# Patient Record
Sex: Male | Born: 1940 | Race: White | Hispanic: No | Marital: Married | Smoking: Former smoker
Health system: Southern US, Community
[De-identification: ages and names within clinical notes are randomized; demographics above are authoritative.]

## PROBLEM LIST (undated history)

## (undated) DIAGNOSIS — M6281 Muscle weakness (generalized): Secondary | ICD-10-CM

## (undated) DIAGNOSIS — Z9289 Personal history of other medical treatment: Secondary | ICD-10-CM

## (undated) DIAGNOSIS — R3912 Poor urinary stream: Secondary | ICD-10-CM

## (undated) DIAGNOSIS — Z973 Presence of spectacles and contact lenses: Secondary | ICD-10-CM

## (undated) DIAGNOSIS — K219 Gastro-esophageal reflux disease without esophagitis: Secondary | ICD-10-CM

## (undated) DIAGNOSIS — R972 Elevated prostate specific antigen [PSA]: Secondary | ICD-10-CM

## (undated) DIAGNOSIS — N400 Enlarged prostate without lower urinary tract symptoms: Secondary | ICD-10-CM

## (undated) DIAGNOSIS — J449 Chronic obstructive pulmonary disease, unspecified: Secondary | ICD-10-CM

## (undated) DIAGNOSIS — N320 Bladder-neck obstruction: Secondary | ICD-10-CM

## (undated) DIAGNOSIS — R399 Unspecified symptoms and signs involving the genitourinary system: Secondary | ICD-10-CM

## (undated) DIAGNOSIS — B91 Sequelae of poliomyelitis: Secondary | ICD-10-CM

## (undated) DIAGNOSIS — R3911 Hesitancy of micturition: Secondary | ICD-10-CM

## (undated) DIAGNOSIS — Z8619 Personal history of other infectious and parasitic diseases: Secondary | ICD-10-CM

## (undated) HISTORY — PX: TONSILLECTOMY: SUR1361

## (undated) HISTORY — DX: Chronic obstructive pulmonary disease, unspecified: J44.9

---

## 2005-08-29 ENCOUNTER — Ambulatory Visit: Payer: Self-pay | Admitting: General Surgery

## 2007-09-10 ENCOUNTER — Ambulatory Visit: Payer: Self-pay | Admitting: General Surgery

## 2009-06-19 LAB — PULMONARY FUNCTION TEST

## 2009-07-31 ENCOUNTER — Ambulatory Visit: Payer: Self-pay | Admitting: Internal Medicine

## 2010-10-06 LAB — HM COLONOSCOPY: HM Colonoscopy: NORMAL

## 2010-10-07 LAB — HM SIGMOIDOSCOPY: HM Sigmoidoscopy: NORMAL

## 2010-10-20 LAB — PULMONARY FUNCTION TEST

## 2010-12-24 ENCOUNTER — Ambulatory Visit: Payer: Self-pay | Admitting: General Surgery

## 2010-12-28 LAB — PATHOLOGY REPORT

## 2011-04-13 ENCOUNTER — Other Ambulatory Visit: Payer: Self-pay | Admitting: Internal Medicine

## 2011-04-13 DIAGNOSIS — Z0000 Encounter for general adult medical examination without abnormal findings: Secondary | ICD-10-CM

## 2011-04-23 ENCOUNTER — Encounter: Payer: Self-pay | Admitting: Internal Medicine

## 2011-04-27 ENCOUNTER — Encounter: Payer: Self-pay | Admitting: Internal Medicine

## 2011-04-27 ENCOUNTER — Ambulatory Visit (INDEPENDENT_AMBULATORY_CARE_PROVIDER_SITE_OTHER): Payer: No Typology Code available for payment source | Admitting: Internal Medicine

## 2011-04-27 VITALS — BP 134/88 | HR 76 | Temp 98.5°F | Resp 16 | Ht 70.5 in | Wt 191.0 lb

## 2011-04-27 DIAGNOSIS — J449 Chronic obstructive pulmonary disease, unspecified: Secondary | ICD-10-CM

## 2011-04-27 DIAGNOSIS — E785 Hyperlipidemia, unspecified: Secondary | ICD-10-CM

## 2011-04-27 NOTE — Patient Instructions (Signed)

## 2011-04-27 NOTE — Progress Notes (Signed)
Subjective:    Patient ID: Joseph POUND Sr., male    DOB: 1941-07-07, 70 y.o.   MRN: [id number]  HPI Joseph Benson is a 70 year old male with a history of COPD and hyperlipidemia who presents for followup. He reports he has been doing well and he denies any complaints today. We reviewed recent lab reports showing a mildly elevated blood sugar fasting at 113 and an elevated cholesterol with LDL of 143. He reports some recent dietary indiscretion and increased intake of high sugar and high fatty foods. He does exercise by walking his dogs. He has not had any recent shortness of breath or cough greater than his baseline. He was recently seen by his pulmonologist and by his endocrinologist with plan for yearly followup.  Outpatient Encounter Prescriptions as of 04/27/2011  Medication Sig Dispense Refill  . albuterol (PROVENTIL HFA;VENTOLIN HFA) 108 (90 BASE) MCG/ACT inhaler Inhale 2 puffs into the lungs every 4 (four) hours as needed.        . budesonide-formoterol (SYMBICORT) 160-4.5 MCG/ACT inhaler Inhale 2 puffs into the lungs 2 (two) times daily.        . cetirizine (ZYRTEC) 10 MG tablet Take 10 mg by mouth daily.        Marland Kitchen omeprazole (PRILOSEC OTC) 20 MG tablet Take 20 mg by mouth daily.        Marland Kitchen PARoxetine (PAXIL) 20 MG tablet Take 20 mg by mouth daily.        Marland Kitchen terazosin (HYTRIN) 5 MG capsule Take 5 mg by mouth at bedtime.          Review of Systems  Constitutional: Negative for fever, chills, activity change, appetite change, fatigue and unexpected weight change.  Eyes: Negative for visual disturbance.  Respiratory: Negative for cough and shortness of breath.   Cardiovascular: Negative for chest pain, palpitations and leg swelling.  Gastrointestinal: Negative for abdominal pain and abdominal distention.  Genitourinary: Negative for dysuria, urgency and difficulty urinating.  Musculoskeletal: Negative for arthralgias and gait problem.  Skin: Negative for color change and rash.  Hematological:  Negative for adenopathy.  Psychiatric/Behavioral: Negative for sleep disturbance and dysphoric mood. The patient is not nervous/anxious.    BP 134/88  Pulse 76  Temp(Src) 98.5 F (36.9 C) (Oral)  Resp 16  Ht 5' 10.5" (1.791 m)  Wt 191 lb (86.637 kg)  BMI 27.02 kg/m2  SpO2 96%     Objective:   Physical Exam  Constitutional: He is oriented to person, place, and time. He appears well-developed and well-nourished. No distress.  HENT:  Head: Normocephalic and atraumatic.  Right Ear: External ear normal.  Left Ear: External ear normal.  Nose: Nose normal.  Mouth/Throat: Oropharynx is clear and moist. No oropharyngeal exudate.  Eyes: Conjunctivae and EOM are normal. Pupils are equal, round, and reactive to light. Right eye exhibits no discharge. Left eye exhibits no discharge. No scleral icterus.  Neck: Normal range of motion. Neck supple. No tracheal deviation present. No thyromegaly present.  Cardiovascular: Normal rate, regular rhythm and normal heart sounds.  Exam reveals no gallop and no friction rub.   No murmur heard. Pulmonary/Chest: Effort normal and breath sounds normal. No respiratory distress. He has no wheezes. He has no rales. He exhibits no tenderness.  Musculoskeletal: Normal range of motion. He exhibits no edema.  Lymphadenopathy:    He has no cervical adenopathy.  Neurological: He is alert and oriented to person, place, and time. No cranial nerve deficit. Coordination normal.  Skin: Skin is  warm and dry. No rash noted. He is not diaphoretic. No erythema. No pallor.  Psychiatric: He has a normal mood and affect. His behavior is normal. Judgment and thought content normal.          Assessment & Plan:  1. COPD - patient with history of COPD which has been stable. We reviewed notes from his pulmonologist today. He will plan to followup in 6 months.  2. Hyperlipidemia - patient with mildly elevated LDL cholesterol on recent labs. Encouraged a diet low in saturated fat  and high in fiber. We will plan to repeat his cholesterol in 6 months.

## 2011-04-29 ENCOUNTER — Encounter: Payer: Self-pay | Admitting: Internal Medicine

## 2011-05-12 ENCOUNTER — Encounter: Payer: Self-pay | Admitting: Internal Medicine

## 2011-09-19 ENCOUNTER — Other Ambulatory Visit: Payer: Self-pay | Admitting: *Deleted

## 2011-09-19 MED ORDER — TERAZOSIN HCL 5 MG PO CAPS: 5.0000 mg | ORAL_CAPSULE | Freq: Every day | ORAL | Status: DC

## 2011-09-19 MED ORDER — PAROXETINE HCL 20 MG PO TABS: 20.0000 mg | ORAL_TABLET | Freq: Every day | ORAL | Status: DC

## 2011-09-22 DIAGNOSIS — J449 Chronic obstructive pulmonary disease, unspecified: Secondary | ICD-10-CM | POA: Diagnosis not present

## 2011-09-22 LAB — PULMONARY FUNCTION TEST

## 2011-10-11 DIAGNOSIS — E0590 Thyrotoxicosis, unspecified without thyrotoxic crisis or storm: Secondary | ICD-10-CM | POA: Diagnosis not present

## 2011-10-24 ENCOUNTER — Encounter: Payer: Self-pay | Admitting: Internal Medicine

## 2011-10-24 ENCOUNTER — Ambulatory Visit (INDEPENDENT_AMBULATORY_CARE_PROVIDER_SITE_OTHER): Payer: Medicare Other | Admitting: Internal Medicine

## 2011-10-24 VITALS — BP 136/78 | HR 83 | Temp 98.5°F | Ht 70.5 in | Wt 196.0 lb

## 2011-10-24 DIAGNOSIS — I10 Essential (primary) hypertension: Secondary | ICD-10-CM

## 2011-10-24 DIAGNOSIS — Z7184 Encounter for health counseling related to travel: Secondary | ICD-10-CM | POA: Insufficient documentation

## 2011-10-24 DIAGNOSIS — F329 Major depressive disorder, single episode, unspecified: Secondary | ICD-10-CM

## 2011-10-24 DIAGNOSIS — F3289 Other specified depressive episodes: Secondary | ICD-10-CM | POA: Diagnosis not present

## 2011-10-24 DIAGNOSIS — Z7189 Other specified counseling: Secondary | ICD-10-CM

## 2011-10-24 DIAGNOSIS — F32A Depression, unspecified: Secondary | ICD-10-CM | POA: Insufficient documentation

## 2011-10-24 DIAGNOSIS — J449 Chronic obstructive pulmonary disease, unspecified: Secondary | ICD-10-CM | POA: Diagnosis not present

## 2011-10-24 LAB — COMPREHENSIVE METABOLIC PANEL
ALT: 18 U/L (ref 0–53)
AST: 22 U/L (ref 0–37)
Albumin: 4.1 g/dL (ref 3.5–5.2)
Alkaline Phosphatase: 63 U/L (ref 39–117)
Glucose, Bld: 110 mg/dL — ABNORMAL HIGH (ref 70–99)
Potassium: 4.4 mEq/L (ref 3.5–5.1)
Sodium: 140 mEq/L (ref 135–145)
Total Bilirubin: 0.7 mg/dL (ref 0.3–1.2)
Total Protein: 7.6 g/dL (ref 6.0–8.3)

## 2011-10-24 NOTE — Assessment & Plan Note (Signed)
Patient traveling to [place] later this year. We discussed briefly the needed vaccines including hepatitis A and B., and typhoid. We also discussed the need for malaria prophylaxis. He will followup at least 6 weeks prior to travel.

## 2011-10-24 NOTE — Assessment & Plan Note (Signed)
Stable with Paxil. Will continue.

## 2011-10-24 NOTE — Progress Notes (Signed)
Subjective:    Patient ID: Joseph MASH Sr., male    DOB: January 14, 1941, 70 y.o.   MRN: [id number]  HPI 71 year old male with history of COPD presents for followup. He reports he is doing well. He has not had any recent episodes of bronchitis or worsening of shortness of breath. He notes that he is planning to take a trip to [place] later this year and questions what vaccines and medications he may need prior to travel.  Outpatient Encounter Prescriptions as of 10/24/2011  Medication Sig Dispense Refill  . albuterol (PROVENTIL HFA;VENTOLIN HFA) 108 (90 BASE) MCG/ACT inhaler Inhale 2 puffs into the lungs every 4 (four) hours as needed.        . budesonide-formoterol (SYMBICORT) 160-4.5 MCG/ACT inhaler Inhale 2 puffs into the lungs 2 (two) times daily.        . cetirizine (ZYRTEC) 10 MG tablet Take 10 mg by mouth daily.        . methimazole (TAPAZOLE) 5 MG tablet Take 5 mg by mouth daily.      Marland Kitchen PARoxetine (PAXIL) 20 MG tablet Take 1 tablet (20 mg total) by mouth daily.  90 tablet  1  . terazosin (HYTRIN) 5 MG capsule Take 1 capsule (5 mg total) by mouth at bedtime.  90 capsule  1  . omeprazole (PRILOSEC OTC) 20 MG tablet Take 20 mg by mouth daily.          Review of Systems  Constitutional: Negative for fever, chills, activity change, appetite change, fatigue and unexpected weight change.  Eyes: Negative for visual disturbance.  Respiratory: Negative for cough and shortness of breath.   Cardiovascular: Negative for chest pain, palpitations and leg swelling.  Gastrointestinal: Negative for abdominal pain and abdominal distention.  Genitourinary: Negative for dysuria, urgency and difficulty urinating.  Musculoskeletal: Negative for arthralgias and gait problem.  Skin: Negative for color change and rash.  Hematological: Negative for adenopathy.  Psychiatric/Behavioral: Negative for sleep disturbance and dysphoric mood. The patient is not nervous/anxious.    BP 136/78  Pulse 83   Temp(Src) 98.5 F (36.9 C) (Oral)  Ht 5' 10.5" (1.791 m)  Wt 196 lb (88.905 kg)  BMI 27.73 kg/m2  SpO2 95%     Objective:   Physical Exam  Constitutional: He is oriented to person, place, and time. He appears well-developed and well-nourished. No distress.  HENT:  Head: Normocephalic and atraumatic.  Right Ear: External ear normal.  Left Ear: External ear normal.  Nose: Nose normal.  Mouth/Throat: Oropharynx is clear and moist. No oropharyngeal exudate.  Eyes: Conjunctivae and EOM are normal. Pupils are equal, round, and reactive to light. Right eye exhibits no discharge. Left eye exhibits no discharge. No scleral icterus.  Neck: Normal range of motion. Neck supple. No tracheal deviation present. No thyromegaly present.  Cardiovascular: Normal rate, regular rhythm and normal heart sounds.  Exam reveals no gallop and no friction rub.   No murmur heard. Pulmonary/Chest: Effort normal and breath sounds normal. No respiratory distress. He has no wheezes. He has no rales. He exhibits no tenderness.  Musculoskeletal: Normal range of motion. He exhibits no edema.  Lymphadenopathy:    He has no cervical adenopathy.  Neurological: He is alert and oriented to person, place, and time. No cranial nerve deficit. Coordination normal.  Skin: Skin is warm and dry. No rash noted. He is not diaphoretic. No erythema. No pallor.  Psychiatric: He has a normal mood and affect. His behavior is normal. Judgment and thought  content normal.          Assessment & Plan:

## 2011-10-24 NOTE — Assessment & Plan Note (Signed)
Symptoms stable. Will continue Symbicort and prn albuterol. Follow up 6 months.

## 2011-10-25 ENCOUNTER — Ambulatory Visit: Payer: No Typology Code available for payment source | Admitting: Internal Medicine

## 2011-11-17 ENCOUNTER — Encounter: Payer: Self-pay | Admitting: Internal Medicine

## 2011-11-24 ENCOUNTER — Encounter: Payer: Self-pay | Admitting: Internal Medicine

## 2011-12-05 ENCOUNTER — Encounter: Payer: Self-pay | Admitting: Internal Medicine

## 2012-01-13 DIAGNOSIS — E0500 Thyrotoxicosis with diffuse goiter without thyrotoxic crisis or storm: Secondary | ICD-10-CM | POA: Diagnosis not present

## 2012-01-17 DIAGNOSIS — E0590 Thyrotoxicosis, unspecified without thyrotoxic crisis or storm: Secondary | ICD-10-CM | POA: Diagnosis not present

## 2012-01-26 ENCOUNTER — Other Ambulatory Visit: Payer: Self-pay | Admitting: Internal Medicine

## 2012-01-26 NOTE — Telephone Encounter (Signed)
Need to confirm that he has NOT had Hep B vaccine. If not, we can give Twinrix. Otherwise, needs Hep A alone.

## 2012-01-26 NOTE — Telephone Encounter (Signed)
Pt came in today wanting to get a rx for the hep a combo  They are going out the country in oct.

## 2012-01-26 NOTE — Telephone Encounter (Signed)
Spoke with patient via telephone and he stated that he has not had the hepatitis vaccine at all.  He will need Twinrix.  He will check to see what pharmacy has this in stock and call me back.

## 2012-01-30 MED ORDER — HEPATITIS A-HEP B RECOMB VAC 720-20 ELU-MCG/ML IM SUSP: 1.0000 mL | Freq: Once | INTRAMUSCULAR | Status: DC

## 2012-01-30 NOTE — Telephone Encounter (Signed)
Spoke with patient's spouse and was advised that [facility] pharmacy has the Twinrix.  Rx sent.

## 2012-02-28 ENCOUNTER — Ambulatory Visit (INDEPENDENT_AMBULATORY_CARE_PROVIDER_SITE_OTHER): Payer: Medicare Other | Admitting: Internal Medicine

## 2012-02-28 ENCOUNTER — Encounter: Payer: Self-pay | Admitting: Internal Medicine

## 2012-02-28 VITALS — BP 130/70 | HR 73 | Temp 97.9°F | Ht 70.5 in | Wt 193.5 lb

## 2012-02-28 DIAGNOSIS — Z23 Encounter for immunization: Secondary | ICD-10-CM | POA: Diagnosis not present

## 2012-02-28 DIAGNOSIS — Z789 Other specified health status: Secondary | ICD-10-CM | POA: Insufficient documentation

## 2012-02-28 DIAGNOSIS — J449 Chronic obstructive pulmonary disease, unspecified: Secondary | ICD-10-CM | POA: Diagnosis not present

## 2012-02-28 MED ORDER — BUDESONIDE-FORMOTEROL FUMARATE 160-4.5 MCG/ACT IN AERO: 2.0000 | INHALATION_SPRAY | Freq: Two times a day (BID) | RESPIRATORY_TRACT | Status: DC

## 2012-02-28 MED ORDER — ALBUTEROL SULFATE HFA 108 (90 BASE) MCG/ACT IN AERS: 2.0000 | INHALATION_SPRAY | RESPIRATORY_TRACT | Status: AC | PRN

## 2012-02-28 MED ORDER — ZOSTER VACCINE LIVE 19400 UNT/0.65ML ~~LOC~~ SOLR: 0.6500 mL | Freq: Once | SUBCUTANEOUS | Status: AC

## 2012-02-28 NOTE — Assessment & Plan Note (Signed)
Patient is planning upcoming travel to [place]. He has received his first dose of Twinrix. He will need malaria prophylaxis but given ongoing questions about mefloquine and FDA cautionary statements, question ideal malaria prophylaxis. Will set up travel consult with ID physician.

## 2012-02-28 NOTE — Assessment & Plan Note (Signed)
Symptoms currently improved after resuming Symbicort. Will continue. I think it would be wise for him to take prednisone taper pack to use in case of onset of bronchitis while he is traveling to [place]. Will plan to write for this prior to his departure.

## 2012-02-28 NOTE — Progress Notes (Signed)
Subjective:    Patient ID: Joseph DELDUCA Sr., male    DOB: Sep 23, 1940, 71 y.o.   MRN: [id number]  HPI 71 year old male with history of COPD presents for followup. He reports that he is generally feeling well. He notes that he recently was placed on a clinical trial by his pulmonologist. This study involved in taking his Spiriva of his typical Symbicort. He reports that shortly after changing medications he developed marked worsening of shortness of breath, wheezing, and cough productive of purulent sputum. He was ultimately taken off the clinical trial and put on prednisone taper and antibiotics. He reports that symptoms have now improved. Aside from this, he reports he is feeling well. He is planning upcoming travel to [place]. He has completed his first hepatitis a and B. vaccine. He has not yet had a travel consult.  Outpatient Encounter Prescriptions as of 02/28/2012  Medication Sig Dispense Refill  . albuterol (PROVENTIL HFA;VENTOLIN HFA) 108 (90 BASE) MCG/ACT inhaler Inhale 2 puffs into the lungs every 4 (four) hours as needed.  18 g  3  . budesonide-formoterol (SYMBICORT) 160-4.5 MCG/ACT inhaler Inhale 2 puffs into the lungs 2 (two) times daily.  1 Inhaler  3  . cetirizine (ZYRTEC) 10 MG tablet Take 10 mg by mouth daily.        . hepatitis A-hepatitis B (TWINRIX) 720-20 ELU-MCG/ML injection Inject 1 mL into the muscle once.  0.5 mL  3  . methimazole (TAPAZOLE) 5 MG tablet Take 5 mg by mouth daily.      Marland Kitchen omeprazole (PRILOSEC OTC) 20 MG tablet Take 20 mg by mouth daily.        Marland Kitchen PARoxetine (PAXIL) 20 MG tablet Take 1 tablet (20 mg total) by mouth daily.  90 tablet  1  . terazosin (HYTRIN) 5 MG capsule Take 1 capsule (5 mg total) by mouth at bedtime.  90 capsule  1  . DISCONTD: albuterol (PROVENTIL HFA;VENTOLIN HFA) 108 (90 BASE) MCG/ACT inhaler Inhale 2 puffs into the lungs every 4 (four) hours as needed.        Marland Kitchen DISCONTD: budesonide-formoterol (SYMBICORT) 160-4.5 MCG/ACT inhaler Inhale 2  puffs into the lungs 2 (two) times daily.        Marland Kitchen zoster vaccine live, PF, (ZOSTAVAX) 56213 UNT/0.65ML injection Inject 19,400 Units into the skin once.  1 each  0    Review of Systems  Constitutional: Negative for fever, chills, activity change, appetite change, fatigue and unexpected weight change.  Eyes: Negative for visual disturbance.  Respiratory: Negative for cough and shortness of breath.   Cardiovascular: Negative for chest pain, palpitations and leg swelling.  Gastrointestinal: Negative for abdominal pain and abdominal distention.  Genitourinary: Negative for dysuria, urgency and difficulty urinating.  Musculoskeletal: Negative for arthralgias and gait problem.  Skin: Negative for color change and rash.  Hematological: Negative for adenopathy.  Psychiatric/Behavioral: Negative for disturbed wake/sleep cycle and dysphoric mood. The patient is not nervous/anxious.    BP 130/70  Pulse 73  Temp 97.9 F (36.6 C) (Oral)  Ht 5' 10.5" (1.791 m)  Wt 193 lb 8 oz (87.771 kg)  BMI 27.37 kg/m2  SpO2 97%     Objective:   Physical Exam  Constitutional: He is oriented to person, place, and time. He appears well-developed and well-nourished. No distress.  HENT:  Head: Normocephalic and atraumatic.  Right Ear: External ear normal.  Left Ear: External ear normal.  Nose: Nose normal.  Mouth/Throat: Oropharynx is clear and moist. No oropharyngeal exudate.  Eyes: Conjunctivae and EOM are normal. Pupils are equal, round, and reactive to light. Right eye exhibits no discharge. Left eye exhibits no discharge. No scleral icterus.  Neck: Normal range of motion. Neck supple. No tracheal deviation present. No thyromegaly present.  Cardiovascular: Normal rate, regular rhythm and normal heart sounds.  Exam reveals no gallop and no friction rub.   No murmur heard. Pulmonary/Chest: Effort normal and breath sounds normal. No respiratory distress. He has no wheezes. He has no rales. He exhibits no  tenderness.  Musculoskeletal: Normal range of motion. He exhibits no edema.  Lymphadenopathy:    He has no cervical adenopathy.  Neurological: He is alert and oriented to person, place, and time. No cranial nerve deficit. Coordination normal.  Skin: Skin is warm and dry. No rash noted. He is not diaphoretic. No erythema. No pallor.  Psychiatric: He has a normal mood and affect. His behavior is normal. Judgment and thought content normal.          Assessment & Plan:

## 2012-03-12 ENCOUNTER — Other Ambulatory Visit: Payer: Self-pay | Admitting: *Deleted

## 2012-03-12 MED ORDER — TERAZOSIN HCL 5 MG PO CAPS: 5.0000 mg | ORAL_CAPSULE | Freq: Every day | ORAL | Status: DC

## 2012-03-12 MED ORDER — PAROXETINE HCL 20 MG PO TABS: 20.0000 mg | ORAL_TABLET | Freq: Every day | ORAL | Status: DC

## 2012-03-20 DIAGNOSIS — J449 Chronic obstructive pulmonary disease, unspecified: Secondary | ICD-10-CM | POA: Diagnosis not present

## 2012-03-27 ENCOUNTER — Encounter: Payer: Self-pay | Admitting: Internal Medicine

## 2012-04-05 ENCOUNTER — Other Ambulatory Visit: Payer: Self-pay | Admitting: Internal Medicine

## 2012-04-05 DIAGNOSIS — Z23 Encounter for immunization: Secondary | ICD-10-CM

## 2012-04-05 MED ORDER — TETANUS-DIPHTH-ACELL PERTUSSIS 5-2-15.5 LF-MCG/0.5 IM SUSP: 0.5000 mL | Freq: Once | INTRAMUSCULAR | Status: DC

## 2012-04-24 DIAGNOSIS — E0590 Thyrotoxicosis, unspecified without thyrotoxic crisis or storm: Secondary | ICD-10-CM | POA: Diagnosis not present

## 2012-05-02 DIAGNOSIS — Z23 Encounter for immunization: Secondary | ICD-10-CM | POA: Diagnosis not present

## 2012-06-16 DIAGNOSIS — H04129 Dry eye syndrome of unspecified lacrimal gland: Secondary | ICD-10-CM | POA: Diagnosis not present

## 2012-08-31 ENCOUNTER — Ambulatory Visit (INDEPENDENT_AMBULATORY_CARE_PROVIDER_SITE_OTHER): Payer: Medicare Other | Admitting: Internal Medicine

## 2012-08-31 ENCOUNTER — Encounter: Payer: Self-pay | Admitting: Internal Medicine

## 2012-08-31 VITALS — BP 118/56 | HR 68 | Temp 98.7°F | Wt 190.0 lb

## 2012-08-31 DIAGNOSIS — E785 Hyperlipidemia, unspecified: Secondary | ICD-10-CM

## 2012-08-31 DIAGNOSIS — J449 Chronic obstructive pulmonary disease, unspecified: Secondary | ICD-10-CM | POA: Diagnosis not present

## 2012-08-31 DIAGNOSIS — L989 Disorder of the skin and subcutaneous tissue, unspecified: Secondary | ICD-10-CM | POA: Diagnosis not present

## 2012-08-31 DIAGNOSIS — Z23 Encounter for immunization: Secondary | ICD-10-CM

## 2012-08-31 DIAGNOSIS — Z0000 Encounter for general adult medical examination without abnormal findings: Secondary | ICD-10-CM

## 2012-08-31 DIAGNOSIS — J3489 Other specified disorders of nose and nasal sinuses: Secondary | ICD-10-CM

## 2012-08-31 DIAGNOSIS — E0590 Thyrotoxicosis, unspecified without thyrotoxic crisis or storm: Secondary | ICD-10-CM

## 2012-08-31 LAB — LIPID PANEL
Cholesterol: 203 mg/dL — ABNORMAL HIGH (ref 0–200)
HDL: 43.3 mg/dL (ref >39.00)
Total CHOL/HDL Ratio: 5
Triglycerides: 85 mg/dL (ref 0.0–149.0)
VLDL: 17 mg/dL (ref 0.0–40.0)

## 2012-08-31 LAB — COMPREHENSIVE METABOLIC PANEL
ALT: 18 U/L (ref 0–53)
AST: 24 U/L (ref 0–37)
Alkaline Phosphatase: 60 U/L (ref 39–117)
Calcium: 9.2 mg/dL (ref 8.4–10.5)
Chloride: 107 mEq/L (ref 96–112)
Creatinine, Ser: 1 mg/dL (ref 0.4–1.5)

## 2012-08-31 LAB — T4, FREE: Free T4: 0.75 ng/dL (ref 0.60–1.60)

## 2012-08-31 MED ORDER — ZOSTER VACCINE LIVE 19400 UNT/0.65ML ~~LOC~~ SOLR: 0.6500 mL | Freq: Once | SUBCUTANEOUS | Status: DC

## 2012-08-31 MED ORDER — GENTAMICIN SULFATE 0.1 % EX OINT: TOPICAL_OINTMENT | Freq: Three times a day (TID) | CUTANEOUS | Status: DC

## 2012-08-31 NOTE — Assessment & Plan Note (Signed)
Symptoms very well controlled with Symbicort and prn albuterol. Will continue.

## 2012-08-31 NOTE — Assessment & Plan Note (Signed)
Nasal ulceration most consistent with impetigo. Will treat with topical gentamicin. Pt will call if no improvement.

## 2012-08-31 NOTE — Progress Notes (Signed)
Subjective:    Patient ID: Joseph MOWATT Sr., male    DOB: May 05, 1941, 72 y.o.   MRN: [id number]  HPI 72 year old male with history of COPD and subclinical hyperthyroidism presents for followup. He recently came back from a trip to [place]. He reports he has been feeling well. He reports symptoms of shortness of breath have been. Well-controlled with use of Symbicort and albuterol as needed. He denies any ongoing cough or exercise intolerance. Next  He is concerned today about skin lesion on his left medial forehead. He is unsure how long this has been present. It is red and sometimes scaling. It is not painful.  He is also concerned today about a lesion in his right nostril. He is unsure how long this has been present. It is described as painful. It sometimes drains clear fluid.  Outpatient Encounter Prescriptions as of 08/31/2012  Medication Sig Dispense Refill  . albuterol (PROVENTIL HFA;VENTOLIN HFA) 108 (90 BASE) MCG/ACT inhaler Inhale 2 puffs into the lungs every 4 (four) hours as needed.  18 g  3  . budesonide-formoterol (SYMBICORT) 160-4.5 MCG/ACT inhaler Inhale 2 puffs into the lungs 2 (two) times daily.  1 Inhaler  3  . cetirizine (ZYRTEC) 10 MG tablet Take 10 mg by mouth daily.        . methimazole (TAPAZOLE) 5 MG tablet Take 5 mg by mouth daily.      Marland Kitchen omeprazole (PRILOSEC OTC) 20 MG tablet Take 20 mg by mouth daily.        Marland Kitchen PARoxetine (PAXIL) 20 MG tablet Take 1 tablet (20 mg total) by mouth daily.  90 tablet  1  . terazosin (HYTRIN) 5 MG capsule Take 1 capsule (5 mg total) by mouth at bedtime.  90 capsule  1   BP 118/56  Pulse 68  Temp 98.7 F (37.1 C)  Wt 190 lb (86.183 kg)  Review of Systems  Constitutional: Negative for fever, chills, activity change, appetite change, fatigue and unexpected weight change.  Eyes: Negative for visual disturbance.  Respiratory: Negative for cough and shortness of breath.   Cardiovascular: Negative for chest pain, palpitations and leg  swelling.  Gastrointestinal: Negative for abdominal pain and abdominal distention.  Genitourinary: Negative for dysuria, urgency and difficulty urinating.  Musculoskeletal: Negative for arthralgias and gait problem.  Skin: Positive for color change and wound. Negative for rash.  Hematological: Negative for adenopathy.  Psychiatric/Behavioral: Negative for sleep disturbance and dysphoric mood. The patient is not nervous/anxious.        Objective:   Physical Exam  Constitutional: He is oriented to person, place, and time. He appears well-developed and well-nourished. No distress.  HENT:  Head: Normocephalic and atraumatic.    Right Ear: External ear normal.  Left Ear: External ear normal.  Nose: Nose normal.    Mouth/Throat: Oropharynx is clear and moist. No oropharyngeal exudate.  Eyes: Conjunctivae normal and EOM are normal. Pupils are equal, round, and reactive to light. Right eye exhibits no discharge. Left eye exhibits no discharge. No scleral icterus.  Neck: Normal range of motion. Neck supple. No tracheal deviation present. No thyromegaly present.  Cardiovascular: Normal rate, regular rhythm and normal heart sounds.  Exam reveals no gallop and no friction rub.   No murmur heard. Pulmonary/Chest: Effort normal and breath sounds normal. No accessory muscle usage. Not tachypneic. No respiratory distress. He has no decreased breath sounds. He has no wheezes. He has no rhonchi. He has no rales. He exhibits no tenderness.  Musculoskeletal: Normal  range of motion. He exhibits no edema.  Lymphadenopathy:    He has no cervical adenopathy.  Neurological: He is alert and oriented to person, place, and time. No cranial nerve deficit. Coordination normal.  Skin: Skin is warm and dry. No rash noted. He is not diaphoretic. No erythema. No pallor.  Psychiatric: He has a normal mood and affect. His behavior is normal. Judgment and thought content normal.          Assessment & Plan:

## 2012-08-31 NOTE — Assessment & Plan Note (Signed)
Will recheck TSH and free T4. Continue methimazole. Follow up with Endocrine as scheduled.

## 2012-09-03 ENCOUNTER — Other Ambulatory Visit: Payer: Self-pay | Admitting: *Deleted

## 2012-09-03 MED ORDER — PAROXETINE HCL 20 MG PO TABS: 20.0000 mg | ORAL_TABLET | Freq: Every day | ORAL | Status: DC

## 2012-09-03 MED ORDER — TERAZOSIN HCL 5 MG PO CAPS: 5.0000 mg | ORAL_CAPSULE | Freq: Every day | ORAL | Status: DC

## 2012-09-15 ENCOUNTER — Other Ambulatory Visit: Payer: Self-pay

## 2012-09-19 DIAGNOSIS — J449 Chronic obstructive pulmonary disease, unspecified: Secondary | ICD-10-CM | POA: Diagnosis not present

## 2012-09-20 ENCOUNTER — Other Ambulatory Visit: Payer: Self-pay | Admitting: *Deleted

## 2012-09-20 MED ORDER — PAROXETINE HCL 20 MG PO TABS: 20.0000 mg | ORAL_TABLET | Freq: Every day | ORAL | Status: DC

## 2012-09-23 ENCOUNTER — Encounter: Payer: Self-pay | Admitting: Internal Medicine

## 2012-09-24 ENCOUNTER — Telehealth: Payer: Self-pay | Admitting: Internal Medicine

## 2012-09-24 MED ORDER — TERAZOSIN HCL 5 MG PO CAPS: 5.0000 mg | ORAL_CAPSULE | Freq: Every day | ORAL | Status: DC

## 2012-09-24 MED ORDER — PAROXETINE HCL 20 MG PO TABS: 20.0000 mg | ORAL_TABLET | Freq: Every day | ORAL | Status: DC

## 2012-09-24 NOTE — Telephone Encounter (Signed)
Pt came in today checking on the terazosin and paroxetine rx.   It looks like rx went to [facility] stated he does not go to [facility]   These rx needs [facility] Pt is completely our of his meds  Needs meds for tonight Please advise pt when this has been called in.

## 2012-09-24 NOTE — Telephone Encounter (Signed)
Rx sent to [facility] per patient request

## 2012-09-30 ENCOUNTER — Encounter: Payer: Self-pay | Admitting: Internal Medicine

## 2012-10-09 DIAGNOSIS — L578 Other skin changes due to chronic exposure to nonionizing radiation: Secondary | ICD-10-CM | POA: Diagnosis not present

## 2012-10-09 DIAGNOSIS — L570 Actinic keratosis: Secondary | ICD-10-CM | POA: Diagnosis not present

## 2012-10-09 DIAGNOSIS — L819 Disorder of pigmentation, unspecified: Secondary | ICD-10-CM | POA: Diagnosis not present

## 2012-10-16 DIAGNOSIS — E0590 Thyrotoxicosis, unspecified without thyrotoxic crisis or storm: Secondary | ICD-10-CM | POA: Diagnosis not present

## 2012-10-18 ENCOUNTER — Other Ambulatory Visit: Payer: Self-pay | Admitting: *Deleted

## 2012-10-18 MED ORDER — PAROXETINE HCL 20 MG PO TABS: 20.0000 mg | ORAL_TABLET | Freq: Every day | ORAL | Status: DC

## 2012-10-18 MED ORDER — METHIMAZOLE 5 MG PO TABS: 5.0000 mg | ORAL_TABLET | Freq: Every day | ORAL | Status: DC

## 2012-10-18 NOTE — Telephone Encounter (Signed)
Rx sent to [facility] pharmacy

## 2012-10-23 DIAGNOSIS — E0590 Thyrotoxicosis, unspecified without thyrotoxic crisis or storm: Secondary | ICD-10-CM | POA: Diagnosis not present

## 2012-11-02 ENCOUNTER — Other Ambulatory Visit: Payer: Self-pay | Admitting: Internal Medicine

## 2012-11-20 ENCOUNTER — Telehealth: Payer: Self-pay | Admitting: Internal Medicine

## 2012-11-20 NOTE — Telephone Encounter (Signed)
Patient overbooked and confirmed appointment time for tomorrow

## 2012-11-20 NOTE — Telephone Encounter (Signed)
We can see tomorrow 11:15

## 2012-11-20 NOTE — Telephone Encounter (Signed)
Fwd to Dr. Walker 

## 2012-11-20 NOTE — Telephone Encounter (Signed)
Can we put him in the 11:15am same day visit for tomorrow?

## 2012-11-20 NOTE — Telephone Encounter (Signed)
Wife called in and states patient is no longer able to control pain with Aleve in his legs. He states that the pain is like a sharp pain going down his legs, the pain has gotten worse over the last few days.  Wife states patient has a history of Polo and he isn't always so willing to tell her that he is in pain. She has left a message for triage nurse however hasn't gotten a call back. I gave him an appointment for Thursday but if he needs to come in earlier or try something different to help with the pain.  She will be at her work number until 1:30 if you would like to discuss more information with her or you can call the patient.

## 2012-11-20 NOTE — Telephone Encounter (Signed)
CAN has already used that appointment slot, but I also gave it to [name]. Overbook or reschedule one of them?

## 2012-11-20 NOTE — Telephone Encounter (Signed)
Patient Information:  Caller Name: Britta Mccreedy (nursing Triage)  Phone: [phone]  Patient: Joseph Benson, Joseph Benson  Gender: Male  DOB: 06-Apr-1941  Age: 72 Years  PCP: Ronna Polio (Adults only)  Office Follow Up:  Does the office need to follow up with this patient?: Yes  Instructions For The Office: no appts available in system; info to office for staff/provider review/workin appt krs/can  RN Note:  Patient has history of polio and chronic back pain.  Onset of new pain which is now going down both legs.  States unable to stand comfortable.  Does not recall injury.  Pain initially has been going down left leg, but now is down both legs as of 11/17/12.  States was given appt 11/22/12 1415 for this but wanted triage. Per back pain protocol, advised appt now; no appts available in Epic.  Info to office/high priority for provider/staff review/workin appt.  May reach spouse at [phone].  krs/can  Symptoms  Reason For Call & Symptoms: back pain  Reviewed Health History In EMR: Yes  Reviewed Medications In EMR: Yes  Reviewed Allergies In EMR: Yes  Reviewed Surgeries / Procedures: Yes  Date of Onset of Symptoms: 11/16/2012  Guideline(s) Used:  Back Pain  Disposition Per Guideline:   Go to Office Now  Reason For Disposition Reached:   Severe back pain  Advice Given:  N/A  Patient Will Follow Care Advice:  YES

## 2012-11-20 NOTE — Telephone Encounter (Signed)
Fine to overbook

## 2012-11-21 ENCOUNTER — Ambulatory Visit (INDEPENDENT_AMBULATORY_CARE_PROVIDER_SITE_OTHER)
Admission: RE | Admit: 2012-11-21 | Discharge: 2012-11-21 | Disposition: A | Discharge status: Home / Self Care | Payer: Medicare Other | Source: Ambulatory Visit | Attending: Internal Medicine | Admitting: Internal Medicine

## 2012-11-21 ENCOUNTER — Other Ambulatory Visit: Payer: Self-pay | Admitting: Internal Medicine

## 2012-11-21 ENCOUNTER — Ambulatory Visit (INDEPENDENT_AMBULATORY_CARE_PROVIDER_SITE_OTHER): Payer: Medicare Other | Admitting: Internal Medicine

## 2012-11-21 ENCOUNTER — Encounter: Payer: Self-pay | Admitting: Internal Medicine

## 2012-11-21 VITALS — BP 122/70 | HR 97 | Temp 98.3°F | Wt 184.0 lb

## 2012-11-21 DIAGNOSIS — M79605 Pain in left leg: Secondary | ICD-10-CM

## 2012-11-21 DIAGNOSIS — M545 Low back pain, unspecified: Secondary | ICD-10-CM

## 2012-11-21 DIAGNOSIS — Z8612 Personal history of poliomyelitis: Secondary | ICD-10-CM | POA: Insufficient documentation

## 2012-11-21 DIAGNOSIS — M47817 Spondylosis without myelopathy or radiculopathy, lumbosacral region: Secondary | ICD-10-CM | POA: Diagnosis not present

## 2012-11-21 MED ORDER — HYDROCODONE-ACETAMINOPHEN 5-325 MG PO TABS: 1.0000 | ORAL_TABLET | Freq: Four times a day (QID) | ORAL | Status: DC | PRN

## 2012-11-21 MED ORDER — PREDNISONE (PAK) 10 MG PO TABS: ORAL_TABLET | ORAL | Status: DC

## 2012-11-21 NOTE — Assessment & Plan Note (Signed)
Severe, relatively sudden worsening of low back pain concerning for ruptured disc. Will get plain film lumbar spine. Will start prednisone taper pack. If xray unrevealing, discussed setting up MRI lumbar spine. Will use hydrocodone prn severe pain. Follow up 1 week.

## 2012-11-21 NOTE — Progress Notes (Signed)
Subjective:    Patient ID: Joseph ZERBE Sr., male    DOB: 08/04/1940, 72 y.o.   MRN: [id number]  HPI 72 year old male with history of COPD and chronic mild low back pain presents for acute visit complaining of several weeks of gradually worsening low back pain. Pain is described as a sharp, located in the bilateral lower back, radiating to both posterior thighs. Pain is improved by sitting up and leaning forward. It is made worse by any sudden movement including coughing as well as lying flat. He denies any known trauma to his back. He denies any change in activities except for some increase in yard work over the last few weeks. He has been taking ibuprofen, Aleve, and using his wife's hydrocodone with minimal improvement. He is having difficulty functioning because of severe pain. Occasionally, he feels that his legs will give out on him. He denies any loss of continence of bowel or bladder. He denies any numbness in his legs.  Outpatient Prescriptions Prior to Visit  Medication Sig Dispense Refill  . albuterol (PROVENTIL HFA;VENTOLIN HFA) 108 (90 BASE) MCG/ACT inhaler Inhale 2 puffs into the lungs every 4 (four) hours as needed.  18 g  3  . budesonide-formoterol (SYMBICORT) 160-4.5 MCG/ACT inhaler Inhale 2 puffs into the lungs 2 (two) times daily.  1 Inhaler  3  . cetirizine (ZYRTEC) 10 MG tablet Take 10 mg by mouth daily.        Marland Kitchen omeprazole (PRILOSEC OTC) 20 MG tablet Take 20 mg by mouth daily.        Marland Kitchen PARoxetine (PAXIL) 20 MG tablet Take 1 tablet (20 mg total) by mouth daily.  30 tablet  11  . terazosin (HYTRIN) 5 MG capsule TAKE  ONE CAPSULE BY MOUTH NIGHTLY AT BEDTIME  60 capsule  6  . gentamicin ointment (GARAMYCIN) 0.1 % Apply topically 3 (three) times daily.  15 g  0  . methimazole (TAPAZOLE) 5 MG tablet Take 1 tablet (5 mg total) by mouth daily.  30 tablet  11  . zoster vaccine live, PF, (ZOSTAVAX) 40981 UNT/0.65ML injection Inject 19,400 Units into the skin once.  1 each  0   No  facility-administered medications prior to visit.    Review of Systems  Constitutional: Negative for fever, chills, activity change, appetite change, fatigue and unexpected weight change.  Eyes: Negative for visual disturbance.  Respiratory: Negative for cough and shortness of breath.   Cardiovascular: Negative for chest pain, palpitations and leg swelling.  Gastrointestinal: Negative for abdominal pain and abdominal distention.  Genitourinary: Negative for dysuria, urgency and difficulty urinating.  Musculoskeletal: Positive for myalgias, back pain, arthralgias and gait problem.  Skin: Negative for color change and rash.  Neurological: Positive for weakness. Negative for numbness.  Hematological: Negative for adenopathy.  Psychiatric/Behavioral: Negative for sleep disturbance and dysphoric mood. The patient is not nervous/anxious.        Objective:   Physical Exam  Constitutional: He is oriented to person, place, and time. He appears well-developed and well-nourished. No distress.  HENT:  Head: Normocephalic and atraumatic.  Right Ear: External ear normal.  Left Ear: External ear normal.  Nose: Nose normal.  Mouth/Throat: Oropharynx is clear and moist. No oropharyngeal exudate.  Eyes: Conjunctivae and EOM are normal. Pupils are equal, round, and reactive to light. Right eye exhibits no discharge. Left eye exhibits no discharge. No scleral icterus.  Neck: Normal range of motion. Neck supple. No tracheal deviation present. No thyromegaly present.  Cardiovascular: Normal rate,  regular rhythm and normal heart sounds.  Exam reveals no gallop and no friction rub.   No murmur heard. Pulmonary/Chest: Effort normal and breath sounds normal. No respiratory distress. He has no wheezes. He has no rales. He exhibits no tenderness.  Musculoskeletal: He exhibits no edema.       Lumbar back: He exhibits decreased range of motion, pain and spasm. He exhibits no tenderness, no bony tenderness, no  edema and no deformity.  Lymphadenopathy:    He has no cervical adenopathy.  Neurological: He is alert and oriented to person, place, and time. No cranial nerve deficit. Coordination normal.  Skin: Skin is warm and dry. No rash noted. He is not diaphoretic. No erythema. No pallor.  Psychiatric: He has a normal mood and affect. His behavior is normal. Judgment and thought content normal.          Assessment & Plan:

## 2012-11-21 NOTE — Assessment & Plan Note (Signed)
Patient with history of polio as a teenager. Now having some progressive neurologic symptoms including chronic progression of lower back and sciatic pain as well as intermittent right upper extremity tremor and weakness. Discussed the setting of neurology evaluation with patient and his wife. Will look into neurology referral to specialist in this area.

## 2012-11-22 ENCOUNTER — Ambulatory Visit: Payer: Medicare Other | Admitting: Internal Medicine

## 2012-11-27 DIAGNOSIS — M549 Dorsalgia, unspecified: Secondary | ICD-10-CM | POA: Diagnosis not present

## 2012-11-27 DIAGNOSIS — M1990 Unspecified osteoarthritis, unspecified site: Secondary | ICD-10-CM | POA: Diagnosis not present

## 2012-11-29 DIAGNOSIS — M1990 Unspecified osteoarthritis, unspecified site: Secondary | ICD-10-CM | POA: Diagnosis not present

## 2012-11-29 DIAGNOSIS — M549 Dorsalgia, unspecified: Secondary | ICD-10-CM | POA: Diagnosis not present

## 2012-12-30 DIAGNOSIS — M1990 Unspecified osteoarthritis, unspecified site: Secondary | ICD-10-CM | POA: Diagnosis not present

## 2012-12-30 DIAGNOSIS — M549 Dorsalgia, unspecified: Secondary | ICD-10-CM | POA: Diagnosis not present

## 2013-01-01 DIAGNOSIS — M549 Dorsalgia, unspecified: Secondary | ICD-10-CM | POA: Diagnosis not present

## 2013-01-01 DIAGNOSIS — M1990 Unspecified osteoarthritis, unspecified site: Secondary | ICD-10-CM | POA: Diagnosis not present

## 2013-02-27 DIAGNOSIS — J449 Chronic obstructive pulmonary disease, unspecified: Secondary | ICD-10-CM | POA: Diagnosis not present

## 2013-04-23 DIAGNOSIS — E0590 Thyrotoxicosis, unspecified without thyrotoxic crisis or storm: Secondary | ICD-10-CM | POA: Diagnosis not present

## 2013-04-30 DIAGNOSIS — E0500 Thyrotoxicosis with diffuse goiter without thyrotoxic crisis or storm: Secondary | ICD-10-CM | POA: Diagnosis not present

## 2013-05-30 DIAGNOSIS — Z23 Encounter for immunization: Secondary | ICD-10-CM | POA: Diagnosis not present

## 2013-06-06 ENCOUNTER — Other Ambulatory Visit: Payer: Self-pay

## 2013-08-30 DIAGNOSIS — J449 Chronic obstructive pulmonary disease, unspecified: Secondary | ICD-10-CM | POA: Diagnosis not present

## 2013-09-30 DIAGNOSIS — R05 Cough: Secondary | ICD-10-CM | POA: Diagnosis not present

## 2013-09-30 DIAGNOSIS — J209 Acute bronchitis, unspecified: Secondary | ICD-10-CM | POA: Diagnosis not present

## 2013-09-30 DIAGNOSIS — R059 Cough, unspecified: Secondary | ICD-10-CM | POA: Diagnosis not present

## 2013-09-30 DIAGNOSIS — J449 Chronic obstructive pulmonary disease, unspecified: Secondary | ICD-10-CM | POA: Diagnosis not present

## 2013-11-11 ENCOUNTER — Other Ambulatory Visit: Payer: Self-pay | Admitting: Internal Medicine

## 2013-11-11 NOTE — Telephone Encounter (Signed)
Last appt 11/21/12

## 2013-11-25 ENCOUNTER — Telehealth: Payer: Self-pay | Admitting: Internal Medicine

## 2013-11-28 ENCOUNTER — Encounter: Payer: Self-pay | Admitting: Adult Health

## 2013-11-28 ENCOUNTER — Ambulatory Visit (INDEPENDENT_AMBULATORY_CARE_PROVIDER_SITE_OTHER): Payer: Medicare Other | Admitting: Adult Health

## 2013-11-28 VITALS — BP 132/78 | HR 86 | Temp 98.0°F | Resp 14 | Wt 192.5 lb

## 2013-11-28 DIAGNOSIS — M545 Low back pain, unspecified: Secondary | ICD-10-CM | POA: Diagnosis not present

## 2013-11-28 MED ORDER — PREDNISONE 10 MG PO TABS: ORAL_TABLET | ORAL | Status: DC

## 2013-11-28 MED ORDER — METHOCARBAMOL 750 MG PO TABS: 750.0000 mg | ORAL_TABLET | Freq: Three times a day (TID) | ORAL | Status: DC | PRN

## 2013-11-28 MED ORDER — HYDROCODONE-ACETAMINOPHEN 5-325 MG PO TABS: 1.0000 | ORAL_TABLET | Freq: Four times a day (QID) | ORAL | Status: DC | PRN

## 2013-11-28 NOTE — Patient Instructions (Addendum)
Start prednisone taper as follows:  Day #1 - take 6 tablets Day #2 - take 5 tablets Day #3 - take 4 tablets Day #4 - take 3 tablets Day #5 - take 2 tablets Day #6 - take 1 tablet  Robaxin 3 times a day as needed for muscle spasms  Do not take any NSAIDS while on the prednisone  Norco 1 tablets as needed every 6 hours for pain.  Ice 4-5 times a day for 15 minutes. May alternate with moist heat.  You may benefit from seeing a chiropractor  Sciatica Sciatica is pain, weakness, numbness, or tingling along the path of the sciatic nerve. The nerve starts in the lower back and runs down the back of each leg. The nerve controls the muscles in the lower leg and in the back of the knee, while also providing sensation to the back of the thigh, lower leg, and the sole of your foot. Sciatica is a symptom of another medical condition. For instance, nerve damage or certain conditions, such as a herniated disk or bone spur on the spine, pinch or put pressure on the sciatic nerve. This causes the pain, weakness, or other sensations normally associated with sciatica. Generally, sciatica only affects one side of the body. CAUSES   Herniated or slipped disc.  Degenerative disk disease.  A pain disorder involving the narrow muscle in the buttocks (piriformis syndrome).  Pelvic injury or fracture.  Pregnancy.  Tumor (rare). SYMPTOMS  Symptoms can vary from mild to very severe. The symptoms usually travel from the low back to the buttocks and down the back of the leg. Symptoms can include:  Mild tingling or dull aches in the lower back, leg, or hip.  Numbness in the back of the calf or sole of the foot.  Burning sensations in the lower back, leg, or hip.  Sharp pains in the lower back, leg, or hip.  Leg weakness.  Severe back pain inhibiting movement. These symptoms may get worse with coughing, sneezing, laughing, or prolonged sitting or standing. Also, being overweight may worsen  symptoms. DIAGNOSIS  Your caregiver will perform a physical exam to look for common symptoms of sciatica. He or she may ask you to do certain movements or activities that would trigger sciatic nerve pain. Other tests may be performed to find the cause of the sciatica. These may include:  Blood tests.  X-rays.  Imaging tests, such as an MRI or CT scan. TREATMENT  Treatment is directed at the cause of the sciatic pain. Sometimes, treatment is not necessary and the pain and discomfort goes away on its own. If treatment is needed, your caregiver may suggest:  Over-the-counter medicines to relieve pain.  Prescription medicines, such as anti-inflammatory medicine, muscle relaxants, or narcotics.  Applying heat or ice to the painful area.  Steroid injections to lessen pain, irritation, and inflammation around the nerve.  Reducing activity during periods of pain.  Exercising and stretching to strengthen your abdomen and improve flexibility of your spine. Your caregiver may suggest losing weight if the extra weight makes the back pain worse.  Physical therapy.  Surgery to eliminate what is pressing or pinching the nerve, such as a bone spur or part of a herniated disk. HOME CARE INSTRUCTIONS   Only take over-the-counter or prescription medicines for pain or discomfort as directed by your caregiver.  Apply ice to the affected area for 20 minutes, 3 4 times a day for the first 48 72 hours. Then try heat in  the same way.  Exercise, stretch, or perform your usual activities if these do not aggravate your pain.  Attend physical therapy sessions as directed by your caregiver.  Keep all follow-up appointments as directed by your caregiver.  Do not wear high heels or shoes that do not provide proper support.  Check your mattress to see if it is too soft. A firm mattress may lessen your pain and discomfort. SEEK IMMEDIATE MEDICAL CARE IF:   You lose control of your bowel or bladder  (incontinence).  You have increasing weakness in the lower back, pelvis, buttocks, or legs.  You have redness or swelling of your back.  You have a burning sensation when you urinate.  You have pain that gets worse when you lie down or awakens you at night.  Your pain is worse than you have experienced in the past.  Your pain is lasting longer than 4 weeks.  You are suddenly losing weight without reason. MAKE SURE YOU:  Understand these instructions.  Will watch your condition.  Will get help right away if you are not doing well or get worse. Document Released: 07/12/2001 Document Revised: 01/17/2012 Document Reviewed: 11/27/2011 [facility] Patient Information 2014 [place], [place].

## 2013-11-28 NOTE — Progress Notes (Signed)
Pre visit review using our clinic review tool, if applicable. No additional management support is needed unless otherwise documented below in the visit note. 

## 2013-11-28 NOTE — Progress Notes (Signed)
   Subjective:    Patient ID: Joseph CornerGeorge R Rufo Sr., male    DOB: Dec 28, 1940, 73 y.o.   MRN: [id number]  HPI Pt is a 73 y/o male who presents to clinic with low back pain radiating down both legs however the left is worse than the right. He was recently on a cruise when his symptoms began. His wife reports that they were unable to do anything on their cruise. They saw the doctor on the ship and was only given NSAIDs and told to apply ice. Patient is status post physical therapy. Recent x-ray of the lumbar spine shows some degenerative changes and anterolisthesis of L4-L5.  Past Medical History  Diagnosis Date  . COPD (chronic obstructive pulmonary disease)     Pt. is being followed by [name].  He has done very well on Symbicort and prn albuterol.  . Thyroid disease     P. noted to have low TSH and normal T4 on labs in past, consistent with subclinical hyperthyroidism. Reports that he was seen by endocrinology and had thyroid US which was normal  . 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   Eyes: Negative.   Respiratory: Negative.   Cardiovascular: Negative.  Negative for chest pain, palpitations and leg swelling.  Genitourinary: Negative.   Musculoskeletal: Positive for back pain (radiating down both legs). Negative for gait problem.  Neurological: Negative.   Psychiatric/Behavioral: Negative.   All other systems reviewed and are negative.      Objective:   Physical Exam  Constitutional: He is oriented to person, place, and time. No distress.  Patient appears to be in significant pain. He is sitting sideways on his right hip 2/2 pain mostly on the left.  HENT:  Head: Normocephalic and atraumatic.  Eyes: Conjunctivae and EOM are normal.  Neck: Neck supple.  Cardiovascular: Normal rate and regular rhythm.   Pulmonary/Chest: Effort normal. No respiratory distress.  Musculoskeletal: He exhibits tenderness.  Low back  pain. Decrease ROM. Did not do straight leg test 2/2 pt having significant pain.  Neurological: He is alert and oriented to person, place, and time. Coordination normal.  Skin: Skin is warm and dry.  Psychiatric: He has a normal mood and affect. His behavior is normal. Judgment and thought content normal.       Assessment & Plan:   1. Low back pain History of sciatic nerve pain. Recent flare while on cruise. Began ~ 11/16/13. Has tried ice, heat and aleve. Start prednisone taper as directed. Instructed not to take any NSAIDs while on the prednisone taper. Norco one tablet every 6 hours as needed for pain. Robaxin 3 times a day as needed for muscle spasms. Ice to the affected area for 15 minutes. It this 4-5 times a day. May use moist heat. Patient may benefit from a chiropractor.

## 2013-12-10 ENCOUNTER — Ambulatory Visit: Payer: Medicare Other | Admitting: Internal Medicine

## 2013-12-18 ENCOUNTER — Ambulatory Visit (INDEPENDENT_AMBULATORY_CARE_PROVIDER_SITE_OTHER): Payer: Medicare Other | Admitting: Adult Health

## 2013-12-18 ENCOUNTER — Encounter: Payer: Self-pay | Admitting: Adult Health

## 2013-12-18 VITALS — BP 120/70 | HR 77 | Temp 98.0°F | Resp 14 | Wt 192.2 lb

## 2013-12-18 DIAGNOSIS — M545 Low back pain, unspecified: Secondary | ICD-10-CM | POA: Diagnosis not present

## 2013-12-18 NOTE — Progress Notes (Signed)
Patient ID: Joseph CornerGeorge R Badeaux Sr., male   DOB: 08/30/1940, 73 y.o.   MRN: [id number]    Subjective:    Patient ID: Joseph CornerGeorge R Gouge Sr., male    DOB: 08/30/1940, 73 y.o.   MRN: [id number]  HPI  Pt is a 73 y/o male who presents to clinic with low back pain radiating down left leg. He was last seen on 11/28/13 with exacerbation of sciatica that flared while he and his wife were on a cruise. He is s/p prednisone taper, pain medication, physical therapy and chiropractic service with no improvement of his symptoms. X-ray of the lumbar spine shows moderate degeneration L3, L4, L5 and S1. Severe degeneration L2 and L3. There is disc wedging L2/L3. Moderate foraminal encroachment at L4/L5; osteophytes L2, L3. 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  No loss of bladder or bowel function. Pt is having difficulty with ADL. Affecting is QOL. Difficulty with sitting, standing, lying. He has been applying ice to the area. Has used heat, TENS unit.   Past Medical History  Diagnosis Date  . COPD (chronic obstructive pulmonary disease)     Pt. is being followed by [name].  He has done very well on Symbicort and prn albuterol.  . Thyroid disease     P. noted to have low TSH and normal T4 on labs in past, consistent with subclinical hyperthyroidism. Reports that he was seen by endocrinology and had thyroid US which was normal  . 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 albuterol (PROVENTIL HFA;VENTOLIN HFA) 108 (90 BASE) MCG/ACT inhaler Inhale 2 puffs into the lungs every 4 (four) hours as needed.  18 g  3  . budesonide-formoterol (SYMBICORT) 160-4.5 MCG/ACT inhaler Inhale 2 puffs into the lungs 2 (two) times daily.  1 Inhaler  3  . cetirizine (ZYRTEC) 10 MG tablet Take 10 mg by mouth daily.        Marland Kitchen. HYDROcodone-acetaminophen (NORCO/VICODIN) 5-325 MG per tablet Take 1 tablet by mouth every 6 (six) hours as  needed.  30 tablet  0  . methocarbamol (ROBAXIN-750) 750 MG tablet Take 1 tablet (750 mg total) by mouth 3 (three) times daily as needed for muscle spasms.  30 tablet  1  . omeprazole (PRILOSEC OTC) 20 MG tablet Take 20 mg by mouth daily.        Marland Kitchen. PARoxetine (PAXIL) 20 MG tablet TAKE ONE TABLET BY MOUTH EVERY DAY  90 tablet  2  . terazosin (HYTRIN) 5 MG capsule TAKE ONE CAPSULE BY MOUTH AT BEDTIME  90 capsule  2   No current facility-administered medications on file prior to visit.     Review of Systems  Constitutional: Negative.   HENT: Negative.   Eyes: Negative.   Respiratory: Negative.   Cardiovascular: Negative.   Gastrointestinal: Negative.   Endocrine: Negative.   Genitourinary: Negative.   Musculoskeletal: Positive for back pain (severe lumbar pain).  Skin: Negative.   Allergic/Immunologic: Negative.   Neurological: Negative for weakness and numbness.       Shooting pain down left leg  Hematological: Negative.   Psychiatric/Behavioral: Negative.        Objective:  BP 120/70  Pulse 77  Temp(Src) 98 F (36.7 C) (Oral)  Resp 14  Wt 192 lb 4 oz (87.204 kg)  SpO2 97%   Physical Exam  Constitutional: He is oriented to person, place,  and time. He appears well-developed and well-nourished. No distress.  HENT:  Head: Normocephalic and atraumatic.  Eyes: Conjunctivae and EOM are normal.  Neck: Neck supple.  Cardiovascular: Normal rate and regular rhythm.   Pulmonary/Chest: Effort normal. No respiratory distress.  Musculoskeletal: He exhibits tenderness.  Neurological: He is alert and oriented to person, place, and time. Coordination normal.  Skin: Skin is warm and dry.  Psychiatric: He has a normal mood and affect. His behavior is normal. Judgment and thought content normal.      Assessment & Plan:   1. Severe lumbar pain Pt with ongoing lumbar pain with radiation down left leg not improved with medication, TENS, ice/heat, physical therapy or chiropractor. Send  for MRI lumbar spine. Pt has appointment on Tuesday in [place]. - MR Lumbar Spine Wo Contrast; Future

## 2013-12-18 NOTE — Progress Notes (Signed)
Pre visit review using our clinic review tool, if applicable. No additional management support is needed unless otherwise documented below in the visit note. 

## 2013-12-24 ENCOUNTER — Ambulatory Visit: Payer: Self-pay | Admitting: Adult Health

## 2013-12-24 DIAGNOSIS — M48061 Spinal stenosis, lumbar region without neurogenic claudication: Secondary | ICD-10-CM | POA: Diagnosis not present

## 2013-12-24 DIAGNOSIS — M47817 Spondylosis without myelopathy or radiculopathy, lumbosacral region: Secondary | ICD-10-CM | POA: Diagnosis not present

## 2013-12-24 DIAGNOSIS — M5137 Other intervertebral disc degeneration, lumbosacral region: Secondary | ICD-10-CM | POA: Diagnosis not present

## 2013-12-26 ENCOUNTER — Other Ambulatory Visit: Payer: Self-pay | Admitting: Adult Health

## 2013-12-26 ENCOUNTER — Encounter: Payer: Self-pay | Admitting: Internal Medicine

## 2013-12-26 DIAGNOSIS — M48061 Spinal stenosis, lumbar region without neurogenic claudication: Secondary | ICD-10-CM

## 2013-12-27 ENCOUNTER — Ambulatory Visit: Payer: Medicare Other | Admitting: Internal Medicine

## 2014-01-08 DIAGNOSIS — Q762 Congenital spondylolisthesis: Secondary | ICD-10-CM | POA: Diagnosis not present

## 2014-01-08 DIAGNOSIS — Z6826 Body mass index (BMI) 26.0-26.9, adult: Secondary | ICD-10-CM | POA: Diagnosis not present

## 2014-01-27 ENCOUNTER — Encounter: Payer: Self-pay | Admitting: Adult Health

## 2014-01-28 DIAGNOSIS — M545 Low back pain, unspecified: Secondary | ICD-10-CM | POA: Diagnosis not present

## 2014-01-28 DIAGNOSIS — IMO0002 Reserved for concepts with insufficient information to code with codable children: Secondary | ICD-10-CM | POA: Diagnosis not present

## 2014-01-29 DIAGNOSIS — J438 Other emphysema: Secondary | ICD-10-CM | POA: Diagnosis not present

## 2014-02-16 ENCOUNTER — Inpatient Hospital Stay: Payer: Self-pay | Admitting: Family Medicine

## 2014-02-16 DIAGNOSIS — M5430 Sciatica, unspecified side: Secondary | ICD-10-CM | POA: Diagnosis present

## 2014-02-16 DIAGNOSIS — I10 Essential (primary) hypertension: Secondary | ICD-10-CM | POA: Diagnosis present

## 2014-02-16 DIAGNOSIS — B9789 Other viral agents as the cause of diseases classified elsewhere: Secondary | ICD-10-CM | POA: Diagnosis present

## 2014-02-16 DIAGNOSIS — J449 Chronic obstructive pulmonary disease, unspecified: Secondary | ICD-10-CM | POA: Diagnosis not present

## 2014-02-16 DIAGNOSIS — Z8612 Personal history of poliomyelitis: Secondary | ICD-10-CM | POA: Diagnosis not present

## 2014-02-16 DIAGNOSIS — R509 Fever, unspecified: Secondary | ICD-10-CM | POA: Diagnosis not present

## 2014-02-16 DIAGNOSIS — R0602 Shortness of breath: Secondary | ICD-10-CM | POA: Diagnosis not present

## 2014-02-16 DIAGNOSIS — R0789 Other chest pain: Secondary | ICD-10-CM | POA: Diagnosis not present

## 2014-02-16 DIAGNOSIS — R000 Tachycardia, unspecified: Secondary | ICD-10-CM | POA: Diagnosis not present

## 2014-02-16 DIAGNOSIS — Z87891 Personal history of nicotine dependence: Secondary | ICD-10-CM | POA: Diagnosis not present

## 2014-02-16 DIAGNOSIS — J440 Chronic obstructive pulmonary disease with acute lower respiratory infection: Secondary | ICD-10-CM | POA: Diagnosis present

## 2014-02-16 DIAGNOSIS — R6510 Systemic inflammatory response syndrome (SIRS) of non-infectious origin without acute organ dysfunction: Secondary | ICD-10-CM | POA: Diagnosis not present

## 2014-02-16 DIAGNOSIS — A809 Acute poliomyelitis, unspecified: Secondary | ICD-10-CM | POA: Diagnosis not present

## 2014-02-16 DIAGNOSIS — R030 Elevated blood-pressure reading, without diagnosis of hypertension: Secondary | ICD-10-CM | POA: Diagnosis not present

## 2014-02-16 LAB — URINALYSIS, COMPLETE
Bacteria: NONE SEEN
Bilirubin,UR: NEGATIVE
Blood: NEGATIVE
Glucose,UR: NEGATIVE mg/dL (ref 0–75)
Leukocyte Esterase: NEGATIVE
Nitrite: NEGATIVE
Ph: 5 (ref 4.5–8.0)
Protein: 30
RBC,UR: 1 /HPF (ref 0–5)
SPECIFIC GRAVITY: 1.024 (ref 1.003–1.030)

## 2014-02-16 LAB — COMPREHENSIVE METABOLIC PANEL
ALT: 18 U/L (ref 12–78)
Albumin: 3.8 g/dL (ref 3.4–5.0)
Alkaline Phosphatase: 67 U/L
Anion Gap: 9 (ref 7–16)
BUN: 14 mg/dL (ref 7–18)
Bilirubin,Total: 1.3 mg/dL — ABNORMAL HIGH (ref 0.2–1.0)
CHLORIDE: 110 mmol/L — AB (ref 98–107)
CO2: 22 mmol/L (ref 21–32)
Calcium, Total: 8.6 mg/dL (ref 8.5–10.1)
Creatinine: 0.97 mg/dL (ref 0.60–1.30)
EGFR (African American): >60
EGFR (Non-African Amer.): >60
Glucose: 135 mg/dL — ABNORMAL HIGH (ref 65–99)
Osmolality: 284 (ref 275–301)
Potassium: 3.7 mmol/L (ref 3.5–5.1)
SGOT(AST): 22 U/L (ref 15–37)
Sodium: 141 mmol/L (ref 136–145)
TOTAL PROTEIN: 7.7 g/dL (ref 6.4–8.2)

## 2014-02-16 LAB — CBC WITH DIFFERENTIAL/PLATELET
BASOS PCT: 0.3 %
Basophil #: 0 10*3/uL (ref 0.0–0.1)
EOS ABS: 0 10*3/uL (ref 0.0–0.7)
Eosinophil %: 0.1 %
HCT: 43.2 % (ref 40.0–52.0)
HGB: 14.6 g/dL (ref 13.0–18.0)
LYMPHS PCT: 10.1 %
Lymphocyte #: 0.9 10*3/uL — ABNORMAL LOW (ref 1.0–3.6)
MCH: 31.8 pg (ref 26.0–34.0)
MCHC: 33.7 g/dL (ref 32.0–36.0)
MCV: 94 fL (ref 80–100)
MONOS PCT: 7.2 %
Monocyte #: 0.7 x10 3/mm (ref 0.2–1.0)
Neutrophil #: 7.7 10*3/uL — ABNORMAL HIGH (ref 1.4–6.5)
Neutrophil %: 82.3 %
Platelet: 192 10*3/uL (ref 150–440)
RBC: 4.58 10*6/uL (ref 4.40–5.90)
RDW: 13.5 % (ref 11.5–14.5)
WBC: 9.3 10*3/uL (ref 3.8–10.6)

## 2014-02-16 LAB — TROPONIN I: Troponin-I: <0.02 ng/mL

## 2014-02-17 ENCOUNTER — Telehealth: Payer: Self-pay | Admitting: Internal Medicine

## 2014-02-17 DIAGNOSIS — R509 Fever, unspecified: Secondary | ICD-10-CM | POA: Diagnosis not present

## 2014-02-17 DIAGNOSIS — A809 Acute poliomyelitis, unspecified: Secondary | ICD-10-CM | POA: Diagnosis not present

## 2014-02-17 DIAGNOSIS — R000 Tachycardia, unspecified: Secondary | ICD-10-CM | POA: Diagnosis not present

## 2014-02-17 DIAGNOSIS — R030 Elevated blood-pressure reading, without diagnosis of hypertension: Secondary | ICD-10-CM | POA: Diagnosis not present

## 2014-02-17 DIAGNOSIS — R0602 Shortness of breath: Secondary | ICD-10-CM | POA: Diagnosis not present

## 2014-02-17 DIAGNOSIS — R0789 Other chest pain: Secondary | ICD-10-CM | POA: Diagnosis not present

## 2014-02-17 DIAGNOSIS — R6510 Systemic inflammatory response syndrome (SIRS) of non-infectious origin without acute organ dysfunction: Secondary | ICD-10-CM | POA: Diagnosis not present

## 2014-02-17 DIAGNOSIS — J449 Chronic obstructive pulmonary disease, unspecified: Secondary | ICD-10-CM | POA: Diagnosis not present

## 2014-02-17 LAB — COMPREHENSIVE METABOLIC PANEL
ALT: 16 U/L (ref 12–78)
ANION GAP: 9 (ref 7–16)
Albumin: 3.2 g/dL — ABNORMAL LOW (ref 3.4–5.0)
Alkaline Phosphatase: 59 U/L
BUN: 15 mg/dL (ref 7–18)
Bilirubin,Total: 1.1 mg/dL — ABNORMAL HIGH (ref 0.2–1.0)
CHLORIDE: 111 mmol/L — AB (ref 98–107)
CO2: 22 mmol/L (ref 21–32)
Calcium, Total: 8.2 mg/dL — ABNORMAL LOW (ref 8.5–10.1)
Creatinine: 0.9 mg/dL (ref 0.60–1.30)
EGFR (Non-African Amer.): >60
Glucose: 114 mg/dL — ABNORMAL HIGH (ref 65–99)
Osmolality: 285 (ref 275–301)
Potassium: 3.6 mmol/L (ref 3.5–5.1)
SGOT(AST): 20 U/L (ref 15–37)
SODIUM: 142 mmol/L (ref 136–145)
TOTAL PROTEIN: 7.1 g/dL (ref 6.4–8.2)

## 2014-02-17 LAB — CBC WITH DIFFERENTIAL/PLATELET
Basophil #: 0 10*3/uL (ref 0.0–0.1)
Basophil %: 0.3 %
Eosinophil #: 0 10*3/uL (ref 0.0–0.7)
Eosinophil %: 0.1 %
HCT: 39.6 % — ABNORMAL LOW (ref 40.0–52.0)
HGB: 13.4 g/dL (ref 13.0–18.0)
LYMPHS ABS: 0.9 10*3/uL — AB (ref 1.0–3.6)
Lymphocyte %: 10.2 %
MCH: 31.8 pg (ref 26.0–34.0)
MCHC: 33.8 g/dL (ref 32.0–36.0)
MCV: 94 fL (ref 80–100)
MONOS PCT: 7.3 %
Monocyte #: 0.7 x10 3/mm (ref 0.2–1.0)
NEUTROS PCT: 82.1 %
Neutrophil #: 7.4 10*3/uL — ABNORMAL HIGH (ref 1.4–6.5)
PLATELETS: 178 10*3/uL (ref 150–440)
RBC: 4.2 10*6/uL — AB (ref 4.40–5.90)
RDW: 13.4 % (ref 11.5–14.5)
WBC: 9.1 10*3/uL (ref 3.8–10.6)

## 2014-02-17 LAB — MAGNESIUM: Magnesium: 1.8 mg/dL

## 2014-02-17 NOTE — Telephone Encounter (Signed)
Velna HatchetSheila from the hospital called and said patient was in the er and was seen for sirs and needs an hospital follow up within 1-2 weeks.

## 2014-02-18 ENCOUNTER — Encounter: Payer: Self-pay | Admitting: Adult Health

## 2014-02-18 ENCOUNTER — Telehealth: Payer: Self-pay | Admitting: Internal Medicine

## 2014-02-18 ENCOUNTER — Ambulatory Visit (INDEPENDENT_AMBULATORY_CARE_PROVIDER_SITE_OTHER): Payer: Medicare Other | Admitting: Adult Health

## 2014-02-18 VITALS — BP 148/88 | HR 92 | Temp 98.0°F | Resp 14 | Wt 184.5 lb

## 2014-02-18 DIAGNOSIS — R509 Fever, unspecified: Secondary | ICD-10-CM | POA: Diagnosis not present

## 2014-02-18 MED ORDER — PROMETHAZINE HCL 25 MG PO TABS: 25.0000 mg | ORAL_TABLET | Freq: Three times a day (TID) | ORAL | Status: DC | PRN

## 2014-02-18 MED ORDER — DOXYCYCLINE HYCLATE 100 MG PO TABS: 100.0000 mg | ORAL_TABLET | Freq: Two times a day (BID) | ORAL | Status: DC

## 2014-02-18 NOTE — Telephone Encounter (Signed)
The patient has been scheduled and he is aware of his appointment on  7.24.15 @ 11:30.

## 2014-02-18 NOTE — Telephone Encounter (Signed)
Patient Information:  Caller Name: Britta MccreedyBarbara  Phone: [phone]  Patient: Joseph Benson, [name]  Gender: Male  DOB: 25-May-1941  Age: 73 Years  PCP: Ronna PolioWalker, Jennifer (Adults only)  Office Follow Up:  Does the office need to follow up with this patient?: No  Instructions For The Office: N/A   Symptoms  Reason For Call & Symptoms: Pt's wife requesting appt today after pt seen in the ED and admitted.  Reviewed Health History In EMR: Yes  Reviewed Medications In EMR: Yes  Reviewed Allergies In EMR: Yes  Reviewed Surgeries / Procedures: Yes  Date of Onset of Symptoms: 02/16/2014  Guideline(s) Used:  Headache  Disposition Per Guideline:   See Today in Office  Reason For Disposition Reached:   Patient wants to be seen  Advice Given:  Call Back If:  You become worse.  Patient Will Follow Care Advice:  YES  Appointment Scheduled:  02/18/2014 10:00:00 Appointment Scheduled Provider:  Orville Governey, Raquel

## 2014-02-18 NOTE — Telephone Encounter (Signed)
Joseph Benson Please help

## 2014-02-18 NOTE — Progress Notes (Signed)
Pre visit review using our clinic review tool, if applicable. No additional management support is needed unless otherwise documented below in the visit note. 

## 2014-02-18 NOTE — Patient Instructions (Signed)
  Take the robaxin that you have at home as prescribed.  You have been prescribed doxycyline. Take the pill BID until completed. If not already taking a probiotic, start a probiotic to decrease the chances of side effects.  Alternate using Tylenol and Ibuprofen for your pain relief.   Apply ice to help with discomfort. Do this for 20 min.  Call if no improvement within 4-5 days or sooner if necessary.

## 2014-02-18 NOTE — Progress Notes (Signed)
Subjective:    Patient ID: Joseph FERDIG Sr., male    DOB: Jan 05, 1941, 73 y.o.   MRN: [id number]  Hypertension Associated symptoms include headaches (From neck to temporal area bilaterally).    73 yo male presents today for a follow-up from hospital admission for fever. His wife is present for the visit and provides all history. He was admitted with fever of unknown origin. He was given one dose of Levaquin in the hospital. Wife expresses concern for his blood pressure and headache. She states that they did not treat his hypertension until he got to the room where his diastolic blood pressure was 120.  Now she states he continues to have headaches and his diastolic blood pressure is down to 102.  Has been taking 400 mg ibuprofen every 6 hours. He was given Tylenol in the hospital which had no effect. States there is a queasiness. Note, blood pressure has improved.  Provides update on sciatica. Was seen was the by Dr. Gerlene Fee and recommended injections. Has received 1 injection thus far with some improvement. Awaiting a second injection and possibly a minimal invasive technique.   Past Medical History  Diagnosis Date  . COPD (chronic obstructive pulmonary disease)     Pt. is being followed by Dr. Meredeth Ide.  He has done very well on Symbicort and prn albuterol.  . Thyroid disease     P. noted to have low TSH and normal T4 on labs in past, consistent with subclinical hyperthyroidism. Reports that he was seen by endocrinology and had thyroid US which was normal  . 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 albuterol (PROVENTIL HFA;VENTOLIN HFA) 108 (90 BASE) MCG/ACT inhaler Inhale 2 puffs into the lungs every 4 (four) hours as needed.  18 g  3  . budesonide-formoterol (SYMBICORT) 160-4.5 MCG/ACT inhaler Inhale 2 puffs into the lungs 2 (two) times daily.  1 Inhaler  3  . cetirizine (ZYRTEC) 10  MG tablet Take 10 mg by mouth daily.        Marland Kitchen glucosamine-chondroitin 500-400 MG tablet Take 1 tablet by mouth 3 (three) times daily.      Marland Kitchen omeprazole (PRILOSEC OTC) 20 MG tablet Take 20 mg by mouth daily.        Marland Kitchen PARoxetine (PAXIL) 20 MG tablet TAKE ONE TABLET BY MOUTH EVERY DAY  90 tablet  2  . terazosin (HYTRIN) 5 MG capsule TAKE ONE CAPSULE BY MOUTH AT BEDTIME  90 capsule  2  . HYDROcodone-acetaminophen (NORCO/VICODIN) 5-325 MG per tablet Take 1 tablet by mouth every 6 (six) hours as needed.  30 tablet  0  . methocarbamol (ROBAXIN-750) 750 MG tablet Take 1 tablet (750 mg total) by mouth 3 (three) times daily as needed for muscle spasms.  30 tablet  1   No current facility-administered medications on file prior to visit.     Review of Systems  Eyes: Negative for photophobia and visual disturbance.  Gastrointestinal: Positive for nausea (queasiness). Negative for vomiting.  Neurological: Positive for headaches (From neck to temporal area bilaterally).  All other systems reviewed and are negative.      Objective:  BP 148/88  Pulse 92  Temp(Src) 98 F (36.7 C) (Oral)  Resp 14  Wt 184 lb 8 oz (83.689 kg)  SpO2 97%   Physical Exam  Constitutional: He is oriented to person, place, and time.  He appears well-developed and well-nourished. No distress.  HENT:  Head: Normocephalic.  Eyes: Conjunctivae and EOM are normal. Pupils are equal, round, and reactive to light.  Cardiovascular: Normal rate, regular rhythm and normal heart sounds.   Pulmonary/Chest: Effort normal and breath sounds normal.  Neurological: He is alert and oriented to person, place, and time.  Skin: Skin is warm and dry.  Psychiatric: He has a normal mood and affect. His behavior is normal. Judgment and thought content normal.      Assessment & Plan:   1. Fever, unspecified Start doxycycline as below to prophalacticaly treat for RMSF or other vector borne infection.Take probiotic for prevention of diarrhea.  For headache continue to take OTC tylenol and ibuprofen alternating between them. Take phenergan as described below as needed for nausea. Follow up if symptoms worsen or fail to improve in 4-5 days or sooner if necessary.  - promethazine (PHENERGAN) 25 MG tablet; Take 1 tablet (25 mg total) by mouth every 8 (eight) hours as needed for nausea or vomiting.  Dispense: 30 tablet; Refill: 0 - doxycycline (VIBRA-TABS) 100 MG tablet; Take 1 tablet (100 mg total) by mouth 2 (two) times daily.  Dispense: 20 tablet; Refill: 0

## 2014-02-18 NOTE — Telephone Encounter (Signed)
appoint scheduled

## 2014-02-21 ENCOUNTER — Encounter: Payer: Self-pay | Admitting: Internal Medicine

## 2014-02-21 ENCOUNTER — Ambulatory Visit (INDEPENDENT_AMBULATORY_CARE_PROVIDER_SITE_OTHER): Payer: Medicare Other | Admitting: Internal Medicine

## 2014-02-21 VITALS — BP 120/62 | HR 98 | Temp 98.2°F | Ht 70.5 in | Wt 184.5 lb

## 2014-02-21 DIAGNOSIS — R509 Fever, unspecified: Secondary | ICD-10-CM

## 2014-02-21 DIAGNOSIS — R6510 Systemic inflammatory response syndrome (SIRS) of non-infectious origin without acute organ dysfunction: Secondary | ICD-10-CM | POA: Diagnosis not present

## 2014-02-21 DIAGNOSIS — IMO0001 Reserved for inherently not codable concepts without codable children: Secondary | ICD-10-CM | POA: Insufficient documentation

## 2014-02-21 LAB — COMPREHENSIVE METABOLIC PANEL
ALBUMIN: 3.7 g/dL (ref 3.5–5.2)
ALK PHOS: 53 U/L (ref 39–117)
ALT: 13 U/L (ref 0–53)
AST: 17 U/L (ref 0–37)
BUN: 20 mg/dL (ref 6–23)
CHLORIDE: 105 meq/L (ref 96–112)
CO2: 30 meq/L (ref 19–32)
Calcium: 9.4 mg/dL (ref 8.4–10.5)
Creatinine, Ser: 0.7 mg/dL (ref 0.4–1.5)
GFR: 112.24 mL/min (ref >60.00)
GLUCOSE: 102 mg/dL — AB (ref 70–99)
POTASSIUM: 3.8 meq/L (ref 3.5–5.1)
SODIUM: 141 meq/L (ref 135–145)
TOTAL PROTEIN: 6.9 g/dL (ref 6.0–8.3)
Total Bilirubin: 1 mg/dL (ref 0.2–1.2)

## 2014-02-21 LAB — CBC WITH DIFFERENTIAL/PLATELET
BASOS ABS: 0 10*3/uL (ref 0.0–0.1)
BASOS PCT: 0.5 % (ref 0.0–3.0)
EOS PCT: 2.3 % (ref 0.0–5.0)
Eosinophils Absolute: 0.1 10*3/uL (ref 0.0–0.7)
HCT: 40.4 % (ref 39.0–52.0)
HEMOGLOBIN: 14 g/dL (ref 13.0–17.0)
LYMPHS PCT: 25.1 % (ref 12.0–46.0)
Lymphs Abs: 1.5 10*3/uL (ref 0.7–4.0)
MCHC: 34.6 g/dL (ref 30.0–36.0)
MCV: 92.4 fl (ref 78.0–100.0)
MONOS PCT: 8.2 % (ref 3.0–12.0)
Monocytes Absolute: 0.5 10*3/uL (ref 0.1–1.0)
NEUTROS ABS: 3.8 10*3/uL (ref 1.4–7.7)
Neutrophils Relative %: 63.9 % (ref 43.0–77.0)
Platelets: 204 10*3/uL (ref 150.0–400.0)
RBC: 4.37 Mil/uL (ref 4.22–5.81)
RDW: 13.9 % (ref 11.5–15.5)
WBC: 6 10*3/uL (ref 4.0–10.5)

## 2014-02-21 LAB — CULTURE, BLOOD (SINGLE)

## 2014-02-21 MED ORDER — DOXYCYCLINE HYCLATE 100 MG PO TABS: 100.0000 mg | ORAL_TABLET | Freq: Two times a day (BID) | ORAL | Status: DC

## 2014-02-21 NOTE — Patient Instructions (Signed)
Labs today.  Continue Doxycycline for 2 full weeks.  Call immediately if any recurrent fever or severe headache.  Increase fluid intake as tolerated.

## 2014-02-21 NOTE — Progress Notes (Addendum)
Subjective:    Patient ID: Joseph DEERY Sr., male    DOB: Jul 11, 1941, 73 y.o.   MRN: [id number]  HPI 73YO male presents for hospital follow up. Admitted July 20th with fever, chills, headache, and dypsnea.  Pt reports that last Saturday morning, he developed headache. This became severe by Sunday night, with some disorientation. Pain was diffuse over head. Could not get comfortable. Entire body seemed to hurt. Had fever Saturday night and Sunday 101F with chills and sweats. No runny nose, cough, sore throat prior to this. No recent known sick exposures. Unsure about tick bites, however has a dog and is out in yard frequently.  Sunday night went to the ED. Was unsteady on feet. Shuffling gait. Could not walk without wheelchair. BP 178/106. Febrile in ED. Treated for possible pneumonia with IV Levaquin. CXR was normal.  No fever on 7/21, so discharged from hospital. Continued to have headache, so was seen in clinic by NP 7/21. Started on Doxycycline for possible Tick-borne illness.  Last night, developed some recurrent headache. Improved with Ibuprofen and/or Tylenol Having some shaking. Appetite has been improved last few days. Still drinking less than normal. Urinating normally. Bowels regular.  Wife has noted some rash over palms and soles.  Review of Systems  Constitutional: Positive for fever, chills, diaphoresis and fatigue. Negative for activity change, appetite change and unexpected weight change.  HENT: Negative for congestion, mouth sores, postnasal drip, rhinorrhea, sinus pressure, sore throat, trouble swallowing and voice change.   Eyes: Negative for visual disturbance.  Respiratory: Negative for cough, chest tightness, shortness of breath, wheezing and stridor.   Cardiovascular: Negative for chest pain, palpitations and leg swelling.  Gastrointestinal: Negative for abdominal pain and abdominal distention.  Genitourinary: Negative for dysuria, urgency, frequency, hematuria,  flank pain, decreased urine volume and difficulty urinating.  Musculoskeletal: Positive for arthralgias and myalgias. Negative for gait problem, neck pain and neck stiffness.  Skin: Positive for color change (some redness noted over palms and soles by pt wife). Negative for rash and wound.  Neurological: Positive for tremors, weakness, light-headedness and headaches. Negative for numbness.  Hematological: Negative for adenopathy. Does not bruise/bleed easily.  Psychiatric/Behavioral: Negative for sleep disturbance and dysphoric mood. The patient is not nervous/anxious.        Objective:    BP 120/62  Pulse 98  Temp(Src) 98.2 F (36.8 C) (Oral)  Ht 5' 10.5" (1.791 m)  Wt 184 lb 8 oz (83.689 kg)  BMI 26.09 kg/m2  SpO2 96% Physical Exam  Constitutional: He is oriented to person, place, and time. He appears well-developed and well-nourished. No distress.  HENT:  Head: Normocephalic and atraumatic.  Right Ear: External ear normal.  Left Ear: External ear normal.  Nose: Nose normal.  Mouth/Throat: Oropharynx is clear and moist. No oropharyngeal exudate.  Eyes: Conjunctivae and EOM are normal. Pupils are equal, round, and reactive to light. Right eye exhibits no discharge. Left eye exhibits no discharge. No scleral icterus.  Neck: Normal range of motion. Neck supple. No muscular tenderness present. No rigidity. No tracheal deviation and normal range of motion present. No mass and no thyromegaly present.  Cardiovascular: Normal rate, regular rhythm and normal heart sounds.  Exam reveals no gallop and no friction rub.   No murmur heard. Pulmonary/Chest: Effort normal and breath sounds normal. No accessory muscle usage. Not tachypneic. No respiratory distress. He has no decreased breath sounds. He has no wheezes. He has no rhonchi. He has no rales. He exhibits no  tenderness.  Musculoskeletal: Normal range of motion. He exhibits no edema.  Lymphadenopathy:    He has no cervical adenopathy.    Neurological: He is alert and oriented to person, place, and time. He displays tremor (bilateral hands). He displays no atrophy. No cranial nerve deficit or sensory deficit. He exhibits normal muscle tone. Gait (slight instability noted) abnormal. Coordination normal.  Skin: Skin is warm and dry. Rash noted. Rash is macular and maculopapular. He is not diaphoretic. No erythema. No pallor.  Few small erythematous macules noted left palm and left lateral foot. Erythematous maculopapular rash over upper chest and upper back.  Psychiatric: He has a normal mood and affect. His behavior is normal. Judgment and thought content normal.          Assessment & Plan:  Over 32mn of which >50% spent in face-to-face contact with patient discussing plan of care  Problem List Items Addressed This Visit     Unprioritized   Infectious systemic inflammatory response syndrome - Primary     S/p recent admission 7/20 through 7/21 for fever, hypertension, confusion, dyspnea, and headache c/w SIRS. Overall, he appears to be improving. Fever has subsided. Headache has improved. Will check CBC, CMP with labs today. Will request reports on blood cultures checked during admission. 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 Will check IgM RMSF and Ehrlichia. Encouraged fluid intake at home. He will avoid taking a planned road trip this weekend. Pt will RTC 1 week and immediately if any recurrent fever, chills, or severe headache.    Relevant Orders      CBC with Differential      Rocky mtn spotted fvr abs pnl(IgG+IgM)      Comp Met (CMET)      Ehrlichia Antibody Panel      Lyme Disease Ab, Quant, IgM      Lyme disease dna by pcr(borrelia burg)    Other Visit Diagnoses   Fever, unspecified        Relevant Medications       doxycycline (VIBRA-TABS) 100 MG tablet        Return in about 1 week (around 02/28/2014) for Recheck.

## 2014-02-21 NOTE — Assessment & Plan Note (Signed)
S/p recent admission 7/20 through 7/21 for fever, hypertension, confusion, dyspnea, and headache c/w SIRS. Overall, he appears to be improving. Fever has subsided. Headache has improved. Will check CBC, CMP with labs today. Will request reports on blood cultures checked during admission. 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 Will check IgM RMSF and Ehrlichia. Encouraged fluid intake at home. He will avoid taking a planned road trip this weekend. Pt will RTC 1 week and immediately if any recurrent fever, chills, or severe headache.

## 2014-02-21 NOTE — Progress Notes (Signed)
Pre visit review using our clinic review tool, if applicable. No additional management support is needed unless otherwise documented below in the visit note. 

## 2014-02-22 LAB — LYME DISEASE DNA BY PCR(BORRELIA BURG): B burgdorferi DNA: NOT DETECTED

## 2014-02-24 ENCOUNTER — Ambulatory Visit: Payer: Medicare Other

## 2014-02-24 DIAGNOSIS — E0590 Thyrotoxicosis, unspecified without thyrotoxic crisis or storm: Secondary | ICD-10-CM

## 2014-02-24 LAB — TSH: TSH: 0.29 u[IU]/mL — AB (ref 0.35–4.50)

## 2014-02-24 NOTE — Addendum Note (Signed)
Addended by: Montine CircleMALDONADO, Yemaya Barnier D on: 02/24/2014 03:11 PM   Modules accepted: Orders

## 2014-02-25 ENCOUNTER — Telehealth: Payer: Self-pay | Admitting: *Deleted

## 2014-02-25 LAB — ROCKY MTN SPOTTED FVR ABS PNL(IGG+IGM)
RMSF IgG: 0.07 IV
RMSF IgM: 1.53 IV

## 2014-02-25 NOTE — Telephone Encounter (Signed)
Reviewed note.  Pt was already placed on doxycycline.  Per Dr Tilman NeatWalker's note, was feeling better.  Please confirm pt continuing to improve.  Thanks.

## 2014-02-25 NOTE — Telephone Encounter (Signed)
Pt called back, gave permission to speak to wife. Wife stated that he is slowly improving, headache has now become dull, appetite is improving, no fever.

## 2014-02-25 NOTE — Telephone Encounter (Signed)
Left vm requesting pt to return my call 

## 2014-02-25 NOTE — Telephone Encounter (Signed)
CRITICAL LAB  -   IgM = 1.53 (repeated and verified)  [facility]

## 2014-02-26 ENCOUNTER — Encounter: Payer: Self-pay | Admitting: Internal Medicine

## 2014-02-26 ENCOUNTER — Other Ambulatory Visit: Payer: Self-pay | Admitting: Internal Medicine

## 2014-02-26 DIAGNOSIS — R7989 Other specified abnormal findings of blood chemistry: Secondary | ICD-10-CM

## 2014-02-26 LAB — EHRLICHIA ANTIBODY PANEL
E chaffeensis (HGE) Ab, IgG: <1:64 {titer}
E chaffeensis (HGE) Ab, IgM: <1:20 {titer}

## 2014-02-26 NOTE — Progress Notes (Signed)
Add on free t3 and free t4 ordered.

## 2014-02-27 ENCOUNTER — Other Ambulatory Visit (INDEPENDENT_AMBULATORY_CARE_PROVIDER_SITE_OTHER): Payer: Medicare Other

## 2014-02-27 DIAGNOSIS — R7989 Other specified abnormal findings of blood chemistry: Secondary | ICD-10-CM

## 2014-02-27 DIAGNOSIS — R946 Abnormal results of thyroid function studies: Secondary | ICD-10-CM | POA: Diagnosis not present

## 2014-02-27 LAB — LYME, WESTERN BLOT, SERUM (REFLEXED)
IGG P18 AB.: ABSENT
IGG P39 AB.: ABSENT
IGG P58 AB.: ABSENT
IGG P66 AB.: ABSENT
IGM P23 AB.: ABSENT
IGM P39 AB.: ABSENT
IgG P23 Ab.: ABSENT
IgG P28 Ab.: ABSENT
IgG P30 Ab.: ABSENT
IgG P45 Ab.: ABSENT
IgG P93 Ab.: ABSENT
LYME IGG WB: NEGATIVE
Lyme IgM Wb: NEGATIVE

## 2014-02-27 LAB — LYME, IGM, EARLY TEST/REFLEX: LYME DISEASE AB, QUANT, IGM: 1.14 index — ABNORMAL HIGH (ref 0.00–0.79)

## 2014-02-27 NOTE — Telephone Encounter (Signed)
Per your message on lab results -  Pt's wife states that he is not currently on any thyroid medication, at one time he was on tapazole but thyroid levels returned to normal and he was taken off the medication.  Pt is doing better, he is not as shaky, not as weak, headaches are still everyday but dull, appetite is good. She states "he is definitely going up hill and not down".  Scheduled pt a lab appt for this afternoon.

## 2014-02-27 NOTE — Telephone Encounter (Signed)
Thanks.  Will follow up on labs once available.  Let us know if he needs anything.  Can FYI Dr Dan HumphreysWalker just so she is aware.

## 2014-02-28 LAB — T3, FREE: T3, Free: 2.6 pg/mL (ref 2.3–4.2)

## 2014-02-28 LAB — T4, FREE: FREE T4: 1 ng/dL (ref 0.60–1.60)

## 2014-03-01 ENCOUNTER — Encounter: Payer: Self-pay | Admitting: Internal Medicine

## 2014-03-24 DIAGNOSIS — IMO0002 Reserved for concepts with insufficient information to code with codable children: Secondary | ICD-10-CM | POA: Diagnosis not present

## 2014-03-24 DIAGNOSIS — M545 Low back pain, unspecified: Secondary | ICD-10-CM | POA: Diagnosis not present

## 2014-03-24 DIAGNOSIS — Q762 Congenital spondylolisthesis: Secondary | ICD-10-CM | POA: Diagnosis not present

## 2014-04-09 ENCOUNTER — Encounter: Payer: Self-pay | Admitting: Internal Medicine

## 2014-04-10 ENCOUNTER — Ambulatory Visit (INDEPENDENT_AMBULATORY_CARE_PROVIDER_SITE_OTHER): Payer: Medicare Other | Admitting: Internal Medicine

## 2014-04-10 ENCOUNTER — Encounter: Payer: Self-pay | Admitting: Internal Medicine

## 2014-04-10 ENCOUNTER — Telehealth: Payer: Self-pay | Admitting: Internal Medicine

## 2014-04-10 VITALS — BP 140/78 | HR 97 | Temp 98.3°F | Resp 14 | Ht 70.5 in | Wt 180.8 lb

## 2014-04-10 DIAGNOSIS — R509 Fever, unspecified: Secondary | ICD-10-CM

## 2014-04-10 LAB — COMPREHENSIVE METABOLIC PANEL
ALT: 17 U/L (ref 0–53)
AST: 21 U/L (ref 0–37)
Albumin: 4 g/dL (ref 3.5–5.2)
Alkaline Phosphatase: 51 U/L (ref 39–117)
BILIRUBIN TOTAL: 0.8 mg/dL (ref 0.2–1.2)
BUN: 15 mg/dL (ref 6–23)
CO2: 24 meq/L (ref 19–32)
CREATININE: 0.9 mg/dL (ref 0.4–1.5)
Calcium: 9.4 mg/dL (ref 8.4–10.5)
Chloride: 104 mEq/L (ref 96–112)
GFR: 87 mL/min (ref >60.00)
GLUCOSE: 89 mg/dL (ref 70–99)
Potassium: 4.7 mEq/L (ref 3.5–5.1)
Sodium: 136 mEq/L (ref 135–145)
Total Protein: 7.6 g/dL (ref 6.0–8.3)

## 2014-04-10 LAB — CBC WITH DIFFERENTIAL/PLATELET
Basophils Absolute: 0 10*3/uL (ref 0.0–0.1)
Basophils Relative: 0.3 % (ref 0.0–3.0)
EOS PCT: 1 % (ref 0.0–5.0)
Eosinophils Absolute: 0.1 10*3/uL (ref 0.0–0.7)
HEMATOCRIT: 43.3 % (ref 39.0–52.0)
HEMOGLOBIN: 14.4 g/dL (ref 13.0–17.0)
LYMPHS ABS: 2.2 10*3/uL (ref 0.7–4.0)
Lymphocytes Relative: 21 % (ref 12.0–46.0)
MCHC: 33.3 g/dL (ref 30.0–36.0)
MCV: 94.7 fl (ref 78.0–100.0)
MONOS PCT: 10.4 % (ref 3.0–12.0)
Monocytes Absolute: 1.1 10*3/uL — ABNORMAL HIGH (ref 0.1–1.0)
NEUTROS ABS: 6.9 10*3/uL (ref 1.4–7.7)
Neutrophils Relative %: 67.3 % (ref 43.0–77.0)
Platelets: 227 10*3/uL (ref 150.0–400.0)
RBC: 4.57 Mil/uL (ref 4.22–5.81)
RDW: 14.7 % (ref 11.5–15.5)
WBC: 10.3 10*3/uL (ref 4.0–10.5)

## 2014-04-10 MED ORDER — HYDROCODONE-ACETAMINOPHEN 5-325 MG PO TABS: 1.0000 | ORAL_TABLET | Freq: Four times a day (QID) | ORAL | Status: DC | PRN

## 2014-04-10 MED ORDER — DOXYCYCLINE HYCLATE 100 MG PO TABS: 100.0000 mg | ORAL_TABLET | Freq: Two times a day (BID) | ORAL | Status: DC

## 2014-04-10 NOTE — Assessment & Plan Note (Signed)
Fever, arthralgia, myalgia and headache most consistent with viral syndrome versus  RMSF, however this seems less likely given recent RMSF infection. Will cover empirically with Doxycycline. CMP and CBC normal today. Follow up for recheck next week. He will call or RTC immediately if recurrent fever, worsening headache, or new symptoms.

## 2014-04-10 NOTE — Telephone Encounter (Signed)
Please advise 

## 2014-04-10 NOTE — Progress Notes (Signed)
Pre visit review using our clinic review tool, if applicable. No additional management support is needed unless otherwise documented below in the visit note. 

## 2014-04-10 NOTE — Telephone Encounter (Signed)
Pt is needing a 1 week 30 minute follow up.

## 2014-04-10 NOTE — Telephone Encounter (Signed)
3:45pm on Tuesday 

## 2014-04-10 NOTE — Patient Instructions (Signed)
Start Doxycycline.  Labs today.  Follow up in 1 week.

## 2014-04-10 NOTE — Progress Notes (Signed)
Subjective:    Patient ID: Joseph PELOQUIN Sr., male    DOB: [birth year], 73 y.o.   MRN: [id number]  HPI 73YO male presents for acute visit.  Headache started 2 days ago, diffuse aching. Fever yesterday, now resolved. Generally not feeling well with myalgia and arthralgia. Symptoms similar to previous infection with RMSF. Feels nauseous. Not eating. No recent tick bites or rash.   Review of Systems  Constitutional: Positive for fever and fatigue. Negative for chills, activity change, appetite change and unexpected weight change.  HENT: Negative for congestion, sore throat and trouble swallowing.   Eyes: Negative for visual disturbance.  Respiratory: Negative for cough and shortness of breath.   Cardiovascular: Negative for chest pain, palpitations and leg swelling.  Gastrointestinal: Positive for nausea. Negative for vomiting, abdominal pain, diarrhea, constipation and abdominal distention.  Genitourinary: Negative for dysuria, urgency and difficulty urinating.  Musculoskeletal: Positive for arthralgias and myalgias. Negative for gait problem, neck pain and neck stiffness.  Skin: Negative for color change and rash.  Neurological: Positive for headaches.  Hematological: Negative for adenopathy.  Psychiatric/Behavioral: Negative for sleep disturbance and dysphoric mood. The patient is not nervous/anxious.        Objective:    BP 140/78  Pulse 97  Temp(Src) 98.3 F (36.8 C) (Oral)  Resp 14  Ht 5' 10.5" (1.791 m)  Wt 180 lb 12 oz (81.988 kg)  BMI 25.56 kg/m2  SpO2 97% Physical Exam  Constitutional: He is oriented to person, place, and time. He appears well-developed and well-nourished. No distress.  HENT:  Head: Normocephalic and atraumatic.  Right Ear: External ear normal.  Left Ear: External ear normal.  Nose: Nose normal.  Mouth/Throat: Oropharynx is clear and moist. No oropharyngeal exudate.  Eyes: Conjunctivae and EOM are normal. Pupils are equal, round, and reactive to  light. Right eye exhibits no discharge. Left eye exhibits no discharge. No scleral icterus.  Neck: Normal range of motion and full passive range of motion without pain. Neck supple. No rigidity. No tracheal deviation present. No Brudzinski's sign noted. No thyromegaly present.  Cardiovascular: Normal rate, regular rhythm and normal heart sounds.  Exam reveals no gallop and no friction rub.   No murmur heard. Pulmonary/Chest: Effort normal and breath sounds normal. No accessory muscle usage. Not tachypneic. No respiratory distress. He has no decreased breath sounds. He has no wheezes. He has no rhonchi. He has no rales. He exhibits no tenderness.  Musculoskeletal: Normal range of motion. He exhibits no edema.  Lymphadenopathy:    He has no cervical adenopathy.  Neurological: He is alert and oriented to person, place, and time. No cranial nerve deficit. Coordination normal.  Skin: Skin is warm and dry. No rash noted. He is not diaphoretic. No erythema. No pallor.  Psychiatric: He has a normal mood and affect. His behavior is normal. Judgment and thought content normal.          Assessment & Plan:   Problem List Items Addressed This Visit     Unprioritized   Fever, unspecified - Primary     Fever, arthralgia, myalgia and headache most consistent with viral syndrome versus  RMSF, however this seems less likely given recent RMSF infection. Will cover empirically with Doxycycline. CMP and CBC normal today. Follow up for recheck next week. He will call or RTC immediately if recurrent fever, worsening headache, or new symptoms.    Relevant Medications      doxycycline (VIBRA-TABS) 100 MG tablet   Other Relevant Orders  Comp Met (CMET) (Completed)      CBC w/Diff (Completed)      Lyme, Total Ab Test/Reflex       Return in about 1 week (around 04/17/2014).

## 2014-04-15 LAB — LYME, WESTERN BLOT, SERUM (REFLEXED)
IGG P30 AB.: ABSENT
IGG P39 AB.: ABSENT
IGG P45 AB.: ABSENT
IGG P58 AB.: ABSENT
IGG P66 AB.: ABSENT
IGM P23 AB.: ABSENT
IGM P39 AB.: ABSENT
IgG P18 Ab.: ABSENT
IgG P23 Ab.: ABSENT
IgG P28 Ab.: ABSENT
IgG P93 Ab.: ABSENT
IgM P41 Ab.: ABSENT
LYME IGG WB: NEGATIVE
LYME IGM WB: NEGATIVE

## 2014-04-15 LAB — LYME, TOTAL AB TEST/REFLEX: LYME IGG/IGM AB: 0.95 {ISR} — AB (ref 0.00–0.90)

## 2014-04-16 DIAGNOSIS — Q762 Congenital spondylolisthesis: Secondary | ICD-10-CM | POA: Diagnosis not present

## 2014-04-16 DIAGNOSIS — Z6825 Body mass index (BMI) 25.0-25.9, adult: Secondary | ICD-10-CM | POA: Diagnosis not present

## 2014-05-09 ENCOUNTER — Other Ambulatory Visit: Payer: Self-pay | Admitting: Internal Medicine

## 2014-05-09 MED ORDER — ZOSTER VACCINE LIVE 19400 UNT/0.65ML ~~LOC~~ SOLR
0.6500 mL | Freq: Once | SUBCUTANEOUS | Status: DC
Start: 2014-05-09 — End: 2014-07-31

## 2014-05-17 DIAGNOSIS — Z23 Encounter for immunization: Secondary | ICD-10-CM | POA: Diagnosis not present

## 2014-05-19 ENCOUNTER — Encounter: Payer: Self-pay | Admitting: Internal Medicine

## 2014-07-10 ENCOUNTER — Encounter: Payer: Self-pay | Admitting: *Deleted

## 2014-07-31 ENCOUNTER — Other Ambulatory Visit: Payer: Self-pay | Admitting: Internal Medicine

## 2014-07-31 ENCOUNTER — Ambulatory Visit (INDEPENDENT_AMBULATORY_CARE_PROVIDER_SITE_OTHER): Payer: Medicare Other | Admitting: Internal Medicine

## 2014-07-31 ENCOUNTER — Encounter: Payer: Self-pay | Admitting: Internal Medicine

## 2014-07-31 VITALS — BP 119/73 | HR 73 | Temp 98.3°F | Ht 69.0 in | Wt 189.2 lb

## 2014-07-31 DIAGNOSIS — Z0000 Encounter for general adult medical examination without abnormal findings: Secondary | ICD-10-CM | POA: Insufficient documentation

## 2014-07-31 DIAGNOSIS — I10 Essential (primary) hypertension: Secondary | ICD-10-CM | POA: Diagnosis not present

## 2014-07-31 DIAGNOSIS — Z23 Encounter for immunization: Secondary | ICD-10-CM

## 2014-07-31 DIAGNOSIS — Z125 Encounter for screening for malignant neoplasm of prostate: Secondary | ICD-10-CM | POA: Diagnosis not present

## 2014-07-31 DIAGNOSIS — E785 Hyperlipidemia, unspecified: Secondary | ICD-10-CM | POA: Diagnosis not present

## 2014-07-31 LAB — CBC WITH DIFFERENTIAL/PLATELET
BASOS ABS: 0 10*3/uL (ref 0.0–0.1)
Basophils Relative: 0 % (ref 0–1)
EOS PCT: 2 % (ref 0–5)
Eosinophils Absolute: 0.1 10*3/uL (ref 0.0–0.7)
HCT: 44.1 % (ref 39.0–52.0)
Hemoglobin: 14.7 g/dL (ref 13.0–17.0)
Lymphocytes Relative: 27 % (ref 12–46)
Lymphs Abs: 1.9 10*3/uL (ref 0.7–4.0)
MCH: 31.1 pg (ref 26.0–34.0)
MCHC: 33.3 g/dL (ref 30.0–36.0)
MCV: 93.4 fL (ref 78.0–100.0)
MONOS PCT: 10 % (ref 3–12)
MPV: 9.8 fL (ref 8.6–12.4)
Monocytes Absolute: 0.7 10*3/uL (ref 0.1–1.0)
NEUTROS ABS: 4.3 10*3/uL (ref 1.7–7.7)
Neutrophils Relative %: 61 % (ref 43–77)
Platelets: 219 10*3/uL (ref 150–400)
RBC: 4.72 MIL/uL (ref 4.22–5.81)
RDW: 13.1 % (ref 11.5–15.5)
WBC: 7 10*3/uL (ref 4.0–10.5)

## 2014-07-31 LAB — COMPREHENSIVE METABOLIC PANEL
ALK PHOS: 62 U/L (ref 39–117)
ALT: 12 U/L (ref 0–53)
AST: 17 U/L (ref 0–37)
Albumin: 4.2 g/dL (ref 3.5–5.2)
BILIRUBIN TOTAL: 0.6 mg/dL (ref 0.2–1.2)
BUN: 14 mg/dL (ref 6–23)
CHLORIDE: 104 meq/L (ref 96–112)
CO2: 28 mEq/L (ref 19–32)
Calcium: 9.4 mg/dL (ref 8.4–10.5)
Creat: 0.87 mg/dL (ref 0.50–1.35)
Glucose, Bld: 86 mg/dL (ref 70–99)
POTASSIUM: 4.2 meq/L (ref 3.5–5.3)
SODIUM: 139 meq/L (ref 135–145)
Total Protein: 7 g/dL (ref 6.0–8.3)

## 2014-07-31 LAB — LIPID PANEL
CHOL/HDL RATIO: 4.6 ratio
CHOLESTEROL: 193 mg/dL (ref 0–200)
HDL: 42 mg/dL (ref >39)
LDL CALC: 123 mg/dL — AB (ref 0–99)
Triglycerides: 141 mg/dL (ref <150)
VLDL: 28 mg/dL (ref 0–40)

## 2014-07-31 NOTE — Assessment & Plan Note (Signed)
General medical exam normal today. Colonoscopy UTD and reviewed. Immunizations UTD except for Prevnar which was given today. Labs today including CBC,CMP, lipids, PSA. Discussed limitations of PSA testing. Encouraged healthy diet and exercise.

## 2014-07-31 NOTE — Progress Notes (Signed)
Pre visit review using our clinic review tool, if applicable. No additional management support is needed unless otherwise documented below in the visit note. 

## 2014-07-31 NOTE — Patient Instructions (Signed)

## 2014-07-31 NOTE — Progress Notes (Signed)
The patient is here for annual Medicare Wellness Examination and management of other chronic and acute problems.   The risk factors are reflected in the history.  The roster of all physicians providing medical care to patient - is listed in the Snapshot section of the chart.  Activities of daily living:   The patient is 100% independent in all ADLs: dressing, toileting, feeding as well as independent mobility. Patient lives with wife. Has a dog and 3 cats. Home is 2 stories. Has carpeted floors and hard floors in kitchen.  Home safety :  The patient has smoke and CO detectors in the home.  They wear seatbelts in their car. There are no firearms at home.  There is no violence in the home. They feel safe where they live.  Infectious Risks: There is no risks for hepatitis, STDs or HIV.  There is no  history of blood transfusion.  They have no travel history to infectious disease endemic areas of the world.  Additional Health Care Providers: The patient has seen their dentist in the last six months. Dentist - Dr. Nancy Marusaphael They have seen their eye doctor in the last year. Opthalmologist - Dr. Rachael Darbyhaudry They deny hearing issues. They have deferred audiologic testing in the last year.   They do not  have excessive sun exposure. Discussed the need for sun protection: hats,long sleeves and use of sunscreen if there is significant sun exposure.  Dermatologist - none  Pulmonologist - [name]  Diet: the importance of a healthy diet is discussed. They do have a healthy diet.  The benefits of regular aerobic exercise were discussed. Exercise has been limited recently. Typically does yard work for exercise. Has some dyspnea chronically with heavy lifting.  Depression screen: there are no signs or vegative symptoms of depression- irritability, change in appetite, anhedonia, sadness/tearfullness.  Cognitive assessment: the patient manages all their financial and personal affairs and is actively  engaged. They could relate day,date,year and events.   HCPOA - wife, Britta MccreedyBarbara   The following portions of the patient's history were reviewed and updated as appropriate: allergies, current medications, past family history, past medical history,  past surgical history, past social history and problem list.  Visual acuity was not assessed per patient preference as they have regular follow up with their ophthalmologist. Hearing and body mass index were assessed and reviewed.   During the course of the visit the patient was educated and counseled about appropriate screening and preventive services including : fall prevention , diabetes screening, nutrition counseling, colorectal cancer screening, and recommended immunizations.    Review of Systems  Constitutional: Negative for fever, chills, activity change, appetite change, fatigue and unexpected weight change.  Eyes: Negative for visual disturbance.  Respiratory: Negative for cough. Shortness of breath: chronic with exertion.   Cardiovascular: Negative for chest pain, palpitations and leg swelling.  Gastrointestinal: Negative for nausea, vomiting, abdominal pain, diarrhea, constipation and abdominal distention.  Genitourinary: Negative for dysuria, urgency and difficulty urinating.  Musculoskeletal: Negative for arthralgias and gait problem.  Skin: Negative for color change and rash.  Hematological: Negative for adenopathy.  Psychiatric/Behavioral: Negative for suicidal ideas, sleep disturbance and dysphoric mood. The patient is not nervous/anxious.        Objective:    BP 119/73 mmHg  Pulse 73  Temp(Src) 98.3 F (36.8 C) (Oral)  Ht 5\' 9"  (1.753 m)  Wt 189 lb 4 oz (85.843 kg)  BMI 27.93 kg/m2  SpO2 96% Physical Exam  Constitutional: He is oriented  to person, place, and time. He appears well-developed and well-nourished. No distress.  HENT:  Head: Normocephalic and atraumatic.  Right Ear: External ear normal.  Left Ear: External  ear normal.  Nose: Nose normal.  Mouth/Throat: Oropharynx is clear and moist. No oropharyngeal exudate.  Eyes: Conjunctivae and EOM are normal. Pupils are equal, round, and reactive to light. Right eye exhibits no discharge. Left eye exhibits no discharge. No scleral icterus.  Neck: Normal range of motion. Neck supple. No tracheal deviation present. No thyromegaly present.  Cardiovascular: Normal rate, regular rhythm and normal heart sounds.  Exam reveals no gallop and no friction rub.   No murmur heard. Pulmonary/Chest: Effort normal and breath sounds normal. No respiratory distress. He has no wheezes. He has no rales. He exhibits no tenderness.  Abdominal: Soft. Bowel sounds are normal. He exhibits no distension and no mass. There is no tenderness. There is no rebound and no guarding.  Musculoskeletal: Normal range of motion. He exhibits no edema.  Lymphadenopathy:    He has no cervical adenopathy.  Neurological: He is alert and oriented to person, place, and time. No cranial nerve deficit. Coordination normal.  Skin: Skin is warm and dry. No rash noted. He is not diaphoretic. No erythema. No pallor.  Psychiatric: He has a normal mood and affect. His behavior is normal. Judgment and thought content normal.          Assessment & Plan:   Problem List Items Addressed This Visit      Unprioritized   Medicare annual wellness visit, subsequent - Primary    General medical exam normal today. Colonoscopy UTD and reviewed. Immunizations UTD except for Prevnar which was given today. Labs today including CBC,CMP, lipids, PSA. Discussed limitations of PSA testing. Encouraged healthy diet and exercise.    Relevant Orders      PSA, Medicare      CBC with Differential      Lipid panel      Microalbumin / creatinine urine ratio      Comprehensive metabolic panel    Other Visit Diagnoses    Need for vaccination with 13-polyvalent pneumococcal conjugate vaccine        Relevant Orders        Pneumococcal conjugate vaccine 13-valent (Completed)        Return in about 6 months (around 01/29/2015) for Recheck.

## 2014-08-01 LAB — MICROALBUMIN / CREATININE URINE RATIO
Creatinine, Urine: 117.9 mg/dL
MICROALB UR: 0.7 mg/dL (ref <2.0)
Microalb Creat Ratio: 5.9 mg/g (ref 0.0–30.0)

## 2014-08-01 LAB — PSA, MEDICARE: PSA: 3.81 ng/mL (ref <=4.00)

## 2014-08-02 ENCOUNTER — Other Ambulatory Visit: Payer: Self-pay | Admitting: Internal Medicine

## 2014-08-11 DIAGNOSIS — R0602 Shortness of breath: Secondary | ICD-10-CM | POA: Diagnosis not present

## 2014-08-11 DIAGNOSIS — J449 Chronic obstructive pulmonary disease, unspecified: Secondary | ICD-10-CM | POA: Diagnosis not present

## 2014-10-31 ENCOUNTER — Telehealth: Payer: Self-pay

## 2014-10-31 NOTE — Telephone Encounter (Signed)
The patient's wife came in and is hoping to have the patient's labs re-coded.  She states they were not covered by [facility].  (copy of printout placed on Vanessa's desk)   Cell callback for pt and wife - [phone]

## 2014-11-13 DIAGNOSIS — N401 Enlarged prostate with lower urinary tract symptoms: Secondary | ICD-10-CM | POA: Diagnosis not present

## 2014-11-13 DIAGNOSIS — R350 Frequency of micturition: Secondary | ICD-10-CM | POA: Diagnosis not present

## 2014-11-22 NOTE — H&P (Signed)
PATIENT NAME:  Joseph Benson, ANTILLA MR#:  [id number] DATE OF BIRTH:  [birth year]  DATE OF ADMISSION:  02/17/2014  PRIMARY CARE PHYSICIAN: Dr. Ronna Polio.   REQUESTING PHYSICIAN: Dr. Carollee Massed.   CHIEF COMPLAINT: Fever, shortness of breath and chills for 2 days.   HISTORY OF PRESENT ILLNESS: The patient is a 74 year old pleasant Caucasian male who is presenting to the ED with a chief complaint of fever with chills and shortness of breath for 2 days. According to the wife, patient was complaining of headache yesterday morning and by afternoon he developed fever. He took Tylenol and relaxed with the hope that he will get better. Eventually, the patient was having chills and became short of breath. As he is becoming weak and tired with fever, chills and shortness of breath, the patient is brought into the ED. In the ED, EKG has revealed sinus tachycardia at 113 beats per minute. Chest x-ray within normal limits. UA is also normal. Blood cultures were obtained. The patient being feverish with tachycardia with unknown source, blood cultures were obtained and the patient was started on empiric antibiotic, IV levofloxacin, and hospitalist team is called to admit the patient. The patient was also given Ativan as he was feeling anxious. During my examination, the patient is lethargic as he was given IV Ativan. Wife is at bedside and history is obtained from his wife. The patient has a chronic history of COPD but does not use oxygen at home. The patient follows up with Dr. Meredeth Ide, pulmonologist as an outpatient regarding his COPD and according to his wife COPD is very well controlled.   PAST MEDICAL HISTORY: Chronic history of COPD, sciatica, polio. Patient is ambulatory with a limp as reported by his wife, a chronic history of sciatica.   PAST SURGICAL HISTORY: Tonsillectomy,   ALLERGIES: No known drug allergies.   PSYCHOSOCIAL HISTORY: Lives at home with wife. He used to smoke at least 1 pack a day and has  approximately 40-45 pack-year smoking history. The patient quit smoking approximately 14 to 15 years ago. He drinks 1 beer with dinner 4-5 times per week. No history of illicit drug usage.   FAMILY HISTORY: Mother deceased with liver cancer. Dad had history of dementia.   HOME MEDICATIONS; Symbicort 160 mcg 2 puffs inhalation 2 times a day, Paxil 20 mg p.o. once daily, cetirizine 10 mg p.o. once daily, albuterol 2 puffs inhalation every 4 times as needed.   REVIEW OF SYSTEMS: Unobtainable as the patient was just given Ativan and he was very lethargic. He opens his eyes but not giving any history.   PHYSICAL EXAMINATION:  VITAL SIGNS: Temperature 100.1, pulse 120, respirations 18, blood pressure 178/106, pulse oximetry is 97% on 2 liters.  GENERAL APPEARANCE: Not in any acute distress. Moderately built and nourished.  HEENT: Normocephalic, atraumatic. Pupils are equally reacting to light and accommodation. No scleral icterus. No conjunctival injection. Moist mucosal membranes.  NECK: Supple. No JVD. No thyromegaly. Range of motion is intact.  LUNGS: Moderate air entry. Clear to auscultation. No wheezing. No accessory muscle usage. No anterior chest wall tenderness on palpation. The patient has bronchial breath sounds.  CARDIAC: S1 and S2 normal. Regular rate and rhythm, tachycardic. No murmurs.  GASTROINTESTINAL: Soft. Bowel sounds are positive in all 4 quadrants. Nontender, nondistended. No hepatosplenomegaly. No masses felt.  NEUROLOGIC: Arousable by very lethargic as patient was just given Ativan IV. Spontaneously moving his extremities and turning his neck. Reflexes are 2+.  EXTREMITIES: No edema. No cyanosis.  No clubbing.  SKIN: Warm to touch. Normal turgor. No rashes, no lesions.  MUSCULOSKELETAL: No joint effusion, tenderness, erythema.  PSYCHIATRIC: Mood and affect could not be elicited as patient is lethargic.  LABORATORIES AND IMAGING STUDIES: Chest x-ray within normal limits. A  12-lead EKG with sinus tachycardia at 113 beats per minute, left axis deviation. No acute ST-T wave changes.   LFTs: Bili total 1.3. The rest of the LFTs are normal. CBC normal. Urinalysis: Yellow in color, clear in appearance. Nitrate and leukocyte esterase are negative. Troponin less than 0.2. BMP: Glucose 135, chloride is at 110. The rest of the BMP is normal. Serum osmolality and calcium are normal.   ASSESSMENT AND PLAN: A 74 year old Caucasian male brought into the ED with a chief complaint of 2-day history of fever with chills and shortness of breath associated with generalized weakness. No history of recent travel.   ASSESSMENT AND PLAN:  1. Systemic inflammatory response syndrome, probably secondary to acute viral syndrome/acute bronchitis. We will provide him symptomatic treatment with oxygen, IV fluids, Tylenol as needed for fever. Pancultures were obtained. We will start him on empiric antibiotic IV levofloxacin for probable superimposed bacterial infection and acute viral syndrome. We will provide nebulizer treatments.  2. Elevated blood pressure with no known history of hypertension. We will provide IV Lopressor as needed basis.  3. Chronic history of chronic obstructive pulmonary disease not under exacerbation. We will provide nebulizer treatment.  4. Chronic history of polio. Will provide Toradol as-needed basis.  5. Will provide gastrointestinal and deep vein thrombosis prophylaxis.  Plan of care was discussed in detail with the patient's wife at bedside.   CODE STATUS: He is full code. Wife is the medical power of attorney.   TOTAL TIME SPENT ON THE ADMISSION: 50 minutes.   ____________________________ Ramonita LabAruna Adalie Mand, MD ag:lt D: 02/17/2014 00:17:17 ET T: 02/17/2014 01:13:24 ET JOB#: [id number]  cc: Ramonita LabAruna Rema Lievanos, MD, <Dictator> Ginette PitmanJennifer A. Dan HumphreysWalker, MD Herbon E. Meredeth IdeFleming, MD  Ramonita LabARUNA Jenniah Bhavsar MD ELECTRONICALLY SIGNED 02/19/2014 1:57

## 2014-11-22 NOTE — Discharge Summary (Signed)
PATIENT NAME:  Joseph Benson, Joseph Benson MR#:  [id number] DATE OF BIRTH:  Nov 01, 1940  DATE OF ADMISSION:  02/16/2014 DATE OF DISCHARGE:  02/17/2014  REASON FOR ADMISSION: Fever and chills and, apparently, shortness of breath for two days.   DISCHARGE DIAGNOSES: 1.  Fever, likely secondary to viral syndrome.  2.  Systemic inflammatory response syndrome, again also likely secondary to acute viral syndrome.  3.  Hypertension.  4.  Chronic obstructive pulmonary disease.  5.  History of polio.    DISPOSITION: Home.   MEDICATIONS AT DISCHARGE: Terazosin 5 mg once a day, Paxil 20 mg once daily, cetirizine 10 mg once daily, Symbicort 160 mg/4.5 mcg 2 puffs twice daily, albuterol HFA inhaler 2 puffs 4 times a day. No antibiotics prescribed to the patient. The patient recommended to take over-the-counter Tylenol if necessary for body aches or fever. Recommended that if fever is above 101.4 to return to the clinic to see Dr. Ronna Polio, or to the hospital if necessary.   HOSPITAL COURSE: This is a very nice, 74 year old gentleman with history of chronic obstructive pulmonary disease, hypertension, sciatica, history of polio, comes to the Emergency Department with a chief complaint of fever for two days. The patient says that he has been feeling very cold, has chills, and it was documented that the patient has shortness of breath. Whenever I interviewed the patient on my own, the patient told me that he has not had any significant shortness of breath and that he always has cough due to his previous history of smoking. The cough has not changed in its characteristics. He is not producing any more sputum than he always does, and whenever he does produce sputum, it is clear and is usually in the mornings. The patient did not feel short of breath at any point, or at least that is what he told me in the interview.   The patient has been taking Tylenol, and was starting to get better, but developed a significant  headache. The headache was severe, making make him feel really bad, and started to spike fevers at home. In the Emergency Department, his temperature was 100.1, and his pulse was 120 beats per minute. The patient was breathing 18 times a minute, was never tachypneic, and, again, he denied any changes in his respiratory status. He had a chest x-ray that did not show any significant new infiltrates or any signs of bronchial or peribronchial thickening. No signs of vascular congestion.   His urinalysis was also unremarkable. The patient did not have any elevation of his white blood cell count. His CBC was overall normal. The patient denies having any significant recent travel. No rashes. No exposure to ticks or insect bites. He states that there is not any significant sick contact as well. Overall, during the examination, the patient did not show any significant abnormalities. His lung exam was overall normal for me. There was no wheezing, rhonchi, or any signs of increased secretions.   The patient was admitted with the following problems:  1.  Systemic inflammatory response syndrome. This is likely secondary to acute viral syndrome. He was admitted under the possible bronchitis, and the patient really did not have any significant exacerbation of his respiratory problems, although he is starting to feel more congested. The patient had Tylenol for fever. His wife is actually a nurse, and she is comfortable taking him home today. The patient was given empiric antibiotics with IV Levofloxacin for a possible superimposed bacterial infection, but the patient did  not have any elevation of his white blood count, and, again, no major changes in his secretion pattern, for which I stopped the antibiotics and the patient went home, mostly with general recommendations like good hydration, rest and Tylenol  over-the-counter.  2.  As far as his hypertension, the blood pressure remained stable.  3.  As far as his chronic  obstructive pulmonary disease, I do not see any signs of exacerbation. No steroids given. The patient had, overall, a good course. He felt really well the morning after admission, for which we decided to discharge him home.   I spent about 40 minutes discharging this patient.  ____________________________ Felipa Furnaceoberto Sanchez Gutierrez, MD rsg:cg D: 02/18/2014 07:29:15 ET T: 02/18/2014 07:46:08 ET JOB#: [id number]  cc: Felipa Furnaceoberto Sanchez Gutierrez, MD, <Dictator> Ginette PitmanJennifer A. Dan HumphreysWalker, MD Regan RakersOBERTO Juanda ChanceSANCHEZ GUTIERRE MD ELECTRONICALLY SIGNED 02/25/2014 0:22

## 2014-12-11 DIAGNOSIS — R339 Retention of urine, unspecified: Secondary | ICD-10-CM | POA: Diagnosis not present

## 2014-12-11 DIAGNOSIS — N401 Enlarged prostate with lower urinary tract symptoms: Secondary | ICD-10-CM | POA: Diagnosis not present

## 2014-12-11 DIAGNOSIS — R351 Nocturia: Secondary | ICD-10-CM | POA: Diagnosis not present

## 2014-12-11 DIAGNOSIS — R350 Frequency of micturition: Secondary | ICD-10-CM | POA: Diagnosis not present

## 2015-01-19 DIAGNOSIS — R350 Frequency of micturition: Secondary | ICD-10-CM | POA: Diagnosis not present

## 2015-01-19 DIAGNOSIS — N401 Enlarged prostate with lower urinary tract symptoms: Secondary | ICD-10-CM | POA: Diagnosis not present

## 2015-01-19 DIAGNOSIS — R339 Retention of urine, unspecified: Secondary | ICD-10-CM | POA: Diagnosis not present

## 2015-01-19 DIAGNOSIS — R351 Nocturia: Secondary | ICD-10-CM | POA: Diagnosis not present

## 2015-01-19 DIAGNOSIS — N390 Urinary tract infection, site not specified: Secondary | ICD-10-CM | POA: Diagnosis not present

## 2015-02-09 DIAGNOSIS — J439 Emphysema, unspecified: Secondary | ICD-10-CM | POA: Diagnosis not present

## 2015-02-09 DIAGNOSIS — R05 Cough: Secondary | ICD-10-CM | POA: Diagnosis not present

## 2015-04-02 DIAGNOSIS — R3912 Poor urinary stream: Secondary | ICD-10-CM | POA: Diagnosis not present

## 2015-04-02 DIAGNOSIS — N401 Enlarged prostate with lower urinary tract symptoms: Secondary | ICD-10-CM | POA: Diagnosis not present

## 2015-04-02 DIAGNOSIS — R3915 Urgency of urination: Secondary | ICD-10-CM | POA: Diagnosis not present

## 2015-04-27 ENCOUNTER — Other Ambulatory Visit: Payer: Self-pay | Admitting: Internal Medicine

## 2015-04-27 DIAGNOSIS — Z23 Encounter for immunization: Secondary | ICD-10-CM | POA: Diagnosis not present

## 2015-04-27 NOTE — Telephone Encounter (Signed)
Last OV 07/31/14 ok to fill?

## 2015-06-01 ENCOUNTER — Encounter: Payer: Self-pay | Admitting: Internal Medicine

## 2015-06-24 DIAGNOSIS — N401 Enlarged prostate with lower urinary tract symptoms: Secondary | ICD-10-CM | POA: Diagnosis not present

## 2015-06-24 DIAGNOSIS — R3912 Poor urinary stream: Secondary | ICD-10-CM | POA: Diagnosis not present

## 2015-07-02 ENCOUNTER — Other Ambulatory Visit: Payer: Self-pay | Admitting: Urology

## 2015-07-17 ENCOUNTER — Encounter (HOSPITAL_BASED_OUTPATIENT_CLINIC_OR_DEPARTMENT_OTHER): Payer: Self-pay | Admitting: *Deleted

## 2015-07-17 NOTE — Progress Notes (Signed)
NPO AFTER MN.  ARRIVE AT 0715.  NEEDS HG.   WILL TAKE PRILOSEC AND DO SYMBICORT INHALER AM DOS W/ SIPS OF WATER.

## 2015-07-20 NOTE — H&P (Signed)
History of Present Illness F/u - PCP Dr. Dan Humphreys.    1-BPH with lower urinary tract symptoms-patient with history of BPH and feelings of weak stream, hesitancy, frequency and urgency. He tried to Zosyn, Rapaflo and tamsulosin without a lot of benefit. PVR was 20 mL.   Current tx: urgency, weak stream, hesitancy    2-prostate cancer screening-  December 2015 PSA 3.81   April 2016-normal digital rectal exam, 2+   Sep 2016 PSA 1.53      Nov 2016 int hx   Patient returns for continued management of BPH and lower urinary tract symptoms. He underwent a prostate ultrasound which showed a 49 g prostate.   Past Medical History Problems  1. History of chronic obstructive lung disease (Z87.09)  Surgical History Problems  1. History of Tonsillectomy  Current Meds 1. Albuterol 90 MCG/ACT AERS;  Therapy: (Recorded:14Apr2016) to Recorded 2. Colace CAPS;  Therapy: (Recorded:01Sep2016) to Recorded 3. Glucosamine Chondr 1500 Complx CAPS;  Therapy: (Recorded:14Apr2016) to Recorded 4. PARoxetine HCl - 10 MG Oral Tablet;  Therapy: (Recorded:14Apr2016) to Recorded 5. Stool Softener CAPS;  Therapy: (Recorded:23Nov2016) to Recorded 6. Symbicort 160-4.5 MCG/ACT Inhalation Aerosol;  Therapy: (Recorded:14Apr2016) to Recorded 7. Tamsulosin HCl - 0.4 MG Oral Capsule; TAKE 1 CAPSULE Bedtime;  Therapy: 12May2016 to (Last Rx:12May2016)  Requested for: 12May2016 Ordered 8. Zyrtec TABS;  Therapy: (Recorded:01Sep2016) to Recorded  Allergies Medication  1. No Known Drug Allergies  Family History Problems  1. Family history of liver cancer (Z80.0) : Mother  Social History Problems  1. Alcohol use (Z78.9) 2. Caffeine use (F15.90) 3. Father deceased   89 4. Former smoker [phone] 5. Mother deceased   32 liver ca 6. Number of children   1 son 7. Occupation   Equities trader Vital Signs [Data Includes: Last 1 Day]  Recorded: 23Nov2016 02:57PM  Blood Pressure:  138 / 83 Temperature: 97.8 F Heart Rate: 79  Physical Exam Constitutional: Well nourished and well developed . No acute distress.  Pulmonary: No respiratory distress and normal respiratory rhythm and effort.  Cardiovascular: Heart rate and rhythm are normal . No peripheral edema.  Neuro/Psych:. Mood and affect are appropriate.    Results/Data Urine [Data Includes: Last 1 Day]   23Nov2016  COLOR YELLOW   APPEARANCE CLEAR   SPECIFIC GRAVITY 1.020   pH 5.0   GLUCOSE NEGATIVE   BILIRUBIN NEGATIVE   KETONE NEGATIVE   BLOOD NEGATIVE   PROTEIN NEGATIVE   NITRITE NEGATIVE   LEUKOCYTE ESTERASE TRACE   SQUAMOUS EPITHELIAL/HPF 0-5 HPF  WBC 0-5 WBC/HPF  RBC 0-2 RBC/HPF  BACTERIA NONE SEEN HPF  CRYSTALS NONE SEEN HPF  CASTS NONE SEEN LPF  Yeast NONE SEEN HPF   The following images/tracing/specimen were independently visualized: Marland Kitchen    Procedure  Procedure: Cystoscopy   Informed Consent: Risks, benefits, and potential adverse events were discussed and informed consent was obtained from the patient.  Prep: The patient was prepped with betadine.  Antibiotic prophylaxis: Ciprofloxacin.  Procedure Note:  Urethral meatus:. No abnormalities.  Anterior urethra: No abnormalities.  Prostatic urethra: No abnormalities . The lateral prostatic lobes were enlarged. L >> R.  Bladder: Visulization was clear. The ureteral orifices were in the normal anatomic position bilaterally and had clear efflux of urine. A systematic survey of the bladder demonstrated no bladder tumors or stones. The mucosa was smooth without abnormalities. The patient tolerated the procedure well.  Complications: None.    Plan Benign localized prostatic hyperplasia with lower urinary tract symptoms (  LUTS)  1. Follow-up Schedule Surgery Office  Follow-up  Status: Hold For - Appointment   Requested for: [id number]    1 Amended By: Jerilee FieldEskridge, Subhan Hoopes; Jun 24 2015 3:32 PM EST  Discussion/Summary    BPH with lower urinary  tract symptoms-49 g prostate. Cysto with left lateral lobe hypertrophy. Asymmetric. I think looking at that he would be a better candidate for something like greenlight photo vaporization or TURP compared to Urolift. we discussed the nature risk and benefits of greenlight photo vaporization of the prostate and also alternatives such as adding medication in the above. We discussed risk of procedures such as stress incontinence, stricture, multiple procedures, bleeding and infection. We discussed in most cases flow symptoms improve but frequency or urgency can sometimes persist or worsen.      Signatures Electronically signed by : Jerilee FieldMatthew Antwine Agosto, M.D.; Jun 24 2015  3:32PM EST

## 2015-07-21 ENCOUNTER — Encounter (HOSPITAL_BASED_OUTPATIENT_CLINIC_OR_DEPARTMENT_OTHER)
Admission: RE | Disposition: A | Discharge status: Home / Self Care | Payer: Self-pay | Source: Ambulatory Visit | Attending: Urology

## 2015-07-21 ENCOUNTER — Ambulatory Visit (HOSPITAL_BASED_OUTPATIENT_CLINIC_OR_DEPARTMENT_OTHER)
Admission: RE | Admit: 2015-07-21 | Discharge: 2015-07-21 | Disposition: A | Discharge status: Home / Self Care | Payer: Medicare Other | Source: Ambulatory Visit | Attending: Urology | Admitting: Urology

## 2015-07-21 ENCOUNTER — Encounter (HOSPITAL_BASED_OUTPATIENT_CLINIC_OR_DEPARTMENT_OTHER): Payer: Self-pay | Admitting: *Deleted

## 2015-07-21 ENCOUNTER — Ambulatory Visit (HOSPITAL_BASED_OUTPATIENT_CLINIC_OR_DEPARTMENT_OTHER): Payer: Medicare Other | Admitting: Anesthesiology

## 2015-07-21 DIAGNOSIS — J449 Chronic obstructive pulmonary disease, unspecified: Secondary | ICD-10-CM | POA: Insufficient documentation

## 2015-07-21 DIAGNOSIS — Z7951 Long term (current) use of inhaled steroids: Secondary | ICD-10-CM | POA: Diagnosis not present

## 2015-07-21 DIAGNOSIS — R3915 Urgency of urination: Secondary | ICD-10-CM | POA: Insufficient documentation

## 2015-07-21 DIAGNOSIS — E0590 Thyrotoxicosis, unspecified without thyrotoxic crisis or storm: Secondary | ICD-10-CM | POA: Diagnosis not present

## 2015-07-21 DIAGNOSIS — R3912 Poor urinary stream: Secondary | ICD-10-CM | POA: Diagnosis not present

## 2015-07-21 DIAGNOSIS — G709 Myoneural disorder, unspecified: Secondary | ICD-10-CM | POA: Diagnosis not present

## 2015-07-21 DIAGNOSIS — Z79899 Other long term (current) drug therapy: Secondary | ICD-10-CM | POA: Insufficient documentation

## 2015-07-21 DIAGNOSIS — R350 Frequency of micturition: Secondary | ICD-10-CM | POA: Diagnosis not present

## 2015-07-21 DIAGNOSIS — Z8612 Personal history of poliomyelitis: Secondary | ICD-10-CM | POA: Diagnosis not present

## 2015-07-21 DIAGNOSIS — Z87891 Personal history of nicotine dependence: Secondary | ICD-10-CM | POA: Diagnosis not present

## 2015-07-21 DIAGNOSIS — K219 Gastro-esophageal reflux disease without esophagitis: Secondary | ICD-10-CM | POA: Insufficient documentation

## 2015-07-21 DIAGNOSIS — N401 Enlarged prostate with lower urinary tract symptoms: Secondary | ICD-10-CM | POA: Insufficient documentation

## 2015-07-21 DIAGNOSIS — N320 Bladder-neck obstruction: Secondary | ICD-10-CM | POA: Diagnosis not present

## 2015-07-21 DIAGNOSIS — R3911 Hesitancy of micturition: Secondary | ICD-10-CM | POA: Diagnosis not present

## 2015-07-21 DIAGNOSIS — N138 Other obstructive and reflux uropathy: Secondary | ICD-10-CM | POA: Insufficient documentation

## 2015-07-21 HISTORY — DX: Elevated prostate specific antigen (PSA): R97.20

## 2015-07-21 HISTORY — DX: Presence of spectacles and contact lenses: Z97.3

## 2015-07-21 HISTORY — DX: Poor urinary stream: R39.12

## 2015-07-21 HISTORY — DX: Sequelae of poliomyelitis: B91

## 2015-07-21 HISTORY — PX: GREEN LIGHT LASER TURP (TRANSURETHRAL RESECTION OF PROSTATE: SHX6260

## 2015-07-21 HISTORY — DX: Sequelae of poliomyelitis: M62.81

## 2015-07-21 HISTORY — DX: Personal history of other medical treatment: Z92.89

## 2015-07-21 HISTORY — DX: Hesitancy of micturition: R39.11

## 2015-07-21 HISTORY — DX: Bladder-neck obstruction: N32.0

## 2015-07-21 HISTORY — DX: Unspecified symptoms and signs involving the genitourinary system: R39.9

## 2015-07-21 HISTORY — DX: Gastro-esophageal reflux disease without esophagitis: K21.9

## 2015-07-21 HISTORY — DX: Personal history of other infectious and parasitic diseases: Z86.19

## 2015-07-21 HISTORY — DX: Benign prostatic hyperplasia without lower urinary tract symptoms: N40.0

## 2015-07-21 SURGERY — GREEN LIGHT LASER TURP (TRANSURETHRAL RESECTION OF PROSTATE
Anesthesia: General | Site: Prostate

## 2015-07-21 MED ORDER — CEPHALEXIN 500 MG PO CAPS
500.0000 mg | ORAL_CAPSULE | Freq: Every day | ORAL | Status: DC
Start: 2015-07-21 — End: 2016-02-16

## 2015-07-21 MED ORDER — DEXAMETHASONE SODIUM PHOSPHATE 10 MG/ML IJ SOLN
INTRAMUSCULAR | Status: AC
  Filled 2015-07-21: qty 1

## 2015-07-21 MED ORDER — CEFAZOLIN SODIUM-DEXTROSE 2-3 GM-% IV SOLR
INTRAVENOUS | Status: AC
  Filled 2015-07-21: qty 50

## 2015-07-21 MED ORDER — METOCLOPRAMIDE HCL 5 MG/ML IJ SOLN
INTRAMUSCULAR | Status: AC
  Filled 2015-07-21: qty 2

## 2015-07-21 MED ORDER — CEFAZOLIN SODIUM-DEXTROSE 2-3 GM-% IV SOLR
2.0000 g | INTRAVENOUS | Status: AC
  Administered 2015-07-21: 2 g via INTRAVENOUS
  Filled 2015-07-21: qty 50

## 2015-07-21 MED ORDER — DEXAMETHASONE SODIUM PHOSPHATE 4 MG/ML IJ SOLN
INTRAMUSCULAR | Status: DC | PRN
  Administered 2015-07-21: 10 mg via INTRAVENOUS

## 2015-07-21 MED ORDER — FENTANYL CITRATE (PF) 100 MCG/2ML IJ SOLN
25.0000 ug | INTRAMUSCULAR | Status: DC | PRN
  Filled 2015-07-21: qty 1

## 2015-07-21 MED ORDER — SODIUM CHLORIDE 0.9 % IR SOLN
Status: DC | PRN
  Administered 2015-07-21: 1000 mL
  Administered 2015-07-21: 6000 mL
  Administered 2015-07-21: 3000 mL

## 2015-07-21 MED ORDER — PROPOFOL 10 MG/ML IV BOLUS
INTRAVENOUS | Status: AC
  Filled 2015-07-21: qty 20

## 2015-07-21 MED ORDER — EPHEDRINE SULFATE 50 MG/ML IJ SOLN
INTRAMUSCULAR | Status: AC
  Filled 2015-07-21: qty 1

## 2015-07-21 MED ORDER — ONDANSETRON HCL 4 MG/2ML IJ SOLN
INTRAMUSCULAR | Status: AC
  Filled 2015-07-21: qty 2

## 2015-07-21 MED ORDER — METOCLOPRAMIDE HCL 5 MG/ML IJ SOLN
INTRAMUSCULAR | Status: DC | PRN
  Administered 2015-07-21: 5 mg via INTRAVENOUS

## 2015-07-21 MED ORDER — FENTANYL CITRATE (PF) 100 MCG/2ML IJ SOLN
INTRAMUSCULAR | Status: AC
  Filled 2015-07-21: qty 2

## 2015-07-21 MED ORDER — PROMETHAZINE HCL 25 MG/ML IJ SOLN
6.2500 mg | INTRAMUSCULAR | Status: DC | PRN
  Filled 2015-07-21: qty 1

## 2015-07-21 MED ORDER — CEFAZOLIN SODIUM 1-5 GM-% IV SOLN
1.0000 g | INTRAVENOUS | Status: DC
  Filled 2015-07-21: qty 50

## 2015-07-21 MED ORDER — PROPOFOL 10 MG/ML IV BOLUS
INTRAVENOUS | Status: DC | PRN
  Administered 2015-07-21: 200 mg via INTRAVENOUS

## 2015-07-21 MED ORDER — LIDOCAINE HCL (CARDIAC) 20 MG/ML IV SOLN
INTRAVENOUS | Status: AC
  Filled 2015-07-21: qty 5

## 2015-07-21 MED ORDER — FENTANYL CITRATE (PF) 100 MCG/2ML IJ SOLN
INTRAMUSCULAR | Status: DC | PRN
  Administered 2015-07-21: 50 ug via INTRAVENOUS

## 2015-07-21 MED ORDER — LACTATED RINGERS IV SOLN
INTRAVENOUS | Status: DC
  Administered 2015-07-21: 08:00:00 via INTRAVENOUS
  Filled 2015-07-21: qty 1000

## 2015-07-21 MED ORDER — EPHEDRINE SULFATE 50 MG/ML IJ SOLN
INTRAMUSCULAR | Status: DC | PRN
  Administered 2015-07-21 (×2): 10 mg via INTRAVENOUS

## 2015-07-21 MED ORDER — LIDOCAINE HCL (CARDIAC) 20 MG/ML IV SOLN
INTRAVENOUS | Status: DC | PRN
  Administered 2015-07-21: 60 mg via INTRAVENOUS
  Administered 2015-07-21: 40 mg via INTRAVENOUS

## 2015-07-21 MED ORDER — ONDANSETRON HCL 4 MG/2ML IJ SOLN
INTRAMUSCULAR | Status: DC | PRN
  Administered 2015-07-21: 4 mg via INTRAVENOUS

## 2015-07-21 SURGICAL SUPPLY — 29 items
BAG URINE DRAINAGE (UROLOGICAL SUPPLIES) ×2 IMPLANT
BAG URO CATCHER STRL LF (MISCELLANEOUS) ×2 IMPLANT
CATH COUDE FOLEY 2W 5CC 18FR (CATHETERS) ×2 IMPLANT
CATH FOLEY 2WAY SLVR  5CC 18FR (CATHETERS)
CATH FOLEY 2WAY SLVR 5CC 18FR (CATHETERS) IMPLANT
CLOTH BEACON ORANGE TIMEOUT ST (SAFETY) ×2 IMPLANT
ELECT BIVAP BIPO 22/24 DONUT (ELECTROSURGICAL)
ELECT LOOP MED HF 24F 12D (CUTTING LOOP) IMPLANT
ELECTRD BIVAP BIPO 22/24 DONUT (ELECTROSURGICAL) IMPLANT
GLOVE BIO SURGEON STRL SZ7.5 (GLOVE) ×2 IMPLANT
GLOVE INDICATOR 7.0 STRL GRN (GLOVE) ×2 IMPLANT
GLOVE INDICATOR 7.5 STRL GRN (GLOVE) ×2 IMPLANT
GLOVE SURG SS PI 7.5 STRL IVOR (GLOVE) ×2 IMPLANT
GOWN STRL REUS W/ TWL XL LVL3 (GOWN DISPOSABLE) ×1 IMPLANT
GOWN STRL REUS W/TWL XL LVL3 (GOWN DISPOSABLE) ×1
HOLDER FOLEY CATH W/STRAP (MISCELLANEOUS) IMPLANT
IV NS 1000ML (IV SOLUTION) ×1
IV NS 1000ML BAXH (IV SOLUTION) ×1 IMPLANT
IV NS IRRIG 3000ML ARTHROMATIC (IV SOLUTION) ×4 IMPLANT
IV SET EXTENSION GRAVITY 40 LF (IV SETS) ×2 IMPLANT
KIT ROOM TURNOVER WOR (KITS) ×2 IMPLANT
LASER FIBER /GREENLIGHT LASER (Laser) ×2 IMPLANT
LASER GREENLIGHT RENTAL P/PROC (Laser) ×2 IMPLANT
LOOP CUT BIPOLAR 24F LRG (ELECTROSURGICAL) IMPLANT
MANIFOLD NEPTUNE II (INSTRUMENTS) ×2 IMPLANT
PACK CYSTO (CUSTOM PROCEDURE TRAY) ×2 IMPLANT
SYR 30ML LL (SYRINGE) IMPLANT
SYRINGE IRR TOOMEY STRL 70CC (SYRINGE) IMPLANT
TUBE CONNECTING 12X1/4 (SUCTIONS) IMPLANT

## 2015-07-21 NOTE — Op Note (Signed)
Preoperative diagnosis: BPH, weak stream Postoperative diagnosis: Same  Procedure: Cystoscopy, greenlight photo vaporization of the prostate, exam under anesthesia  Surgeon: Mena GoesEskridge  Anesthesia: Gen.  Indication for procedure: 74 year old with progressive voiding symptoms and visual obstruction of the prostatic urethra from a left greater than right lateral lobe. He elected to proceed with photo vaporization of the prostate.   Findings: On cystoscopy the urethra was normal, the prostatic urethra was obstructed by lateral lobes primarily a left lateral lobe. The prostatic urethra was not particularly elongated and there was no median lobe. Very minimal median vaporization was needed or done. The trigone and ureteral orifices were in their normal orthotopic position with clear efflux before and after vaporization. The bladder contained no stone or foreign body. The mucosa appeared normal.  On exam under anesthesia the penis was circumcised without mass or lesion. Testicles descended bilaterally and palpably normal. On digital rectal exam post vaporization the prostate was smooth, mildly enlarged consistent with BPH and no hard areas or nodules. All landmarks were preserved.  Description of procedure: After consent was obtained patient brought to the operating room. After adequate anesthesia he is placed in lithotomy position and prepped and draped in the usual sterile fashion. The cystoscope was passed per urethra and the urethra and bladder inspected. The laser fiber was passed and I started at a setting of 80 W and vaporize at the 7:00 position to make an incision down to the prostate and bladder neck. I went ahead and carried this incision down to the apex in the usual hockey-stick fashion on the right. The right lateral lobe again was much smaller than the left and I went ahead lasered some of the right leg to create some room. Then made a similar incision at the 5:00 position on the left brought  this down to the apex. The left lateral lobe pushed all the way across the midline to the other side of the veru. Then vaporized this back toward the original 5:00 incision. I then worked my way anterior to posterior to bladder neck down to the veru and back up to the bladder neck on the left. The right lateral lobe was then finalized with a similar approach anterior to posterior bladder neck down to veru. This created an excellent channel. The bladder was drained and a low pressure hemostasis was excellent and a good open channel persisted. The bladder was refilled and the scope removed. About 80,000 J were utilized. An 7418 French coud catheter was placed in the balloon inflated to 15 mL and left gravity drainage. The balloon was seated at the bladder neck.   drainage was clear. An exam under anesthesia/TRU was performed. The patient was awakened and taken to the recovery room in stable condition.   Complications: None   Blood loss: Minimal   Specimens: None   Drains: 18 French coud catheter

## 2015-07-21 NOTE — Anesthesia Preprocedure Evaluation (Addendum)
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  Neuromuscular disease    GI/Hepatic Neg liver ROS, GERD  Medicated,  Endo/Other  Hyperthyroidism   Renal/GU negative Renal ROS  negative genitourinary   Musculoskeletal negative musculoskeletal ROS (+)   Abdominal   Peds negative pediatric ROS (+)  Hematology negative hematology ROS (+)   Anesthesia Other Findings   Reproductive/Obstetrics negative OB ROS                           Anesthesia Physical Anesthesia Plan  ASA: II  Anesthesia Plan: General   Post-op Pain Management:    Induction: Intravenous  Airway Management Planned:   Additional Equipment:   Intra-op Plan:   Post-operative Plan: Extubation in OR  Informed Consent: I have reviewed the patients History and Physical, chart, labs and discussed the procedure including the risks, benefits and alternatives for the proposed anesthesia with the patient or authorized representative who has indicated his/her understanding and acceptance.   Dental advisory given  Plan Discussed with: CRNA  Anesthesia Plan Comments:         Anesthesia Quick Evaluation

## 2015-07-21 NOTE — Interval H&P Note (Signed)
History and Physical Interval Note:  07/21/2015 8:47 AM  Joseph CornerGeorge R Beaumont Sr.  has presented today for surgery, with the diagnosis of BPH WITH BLADDER OUTLET OBSTRUCTION   The various methods of treatment have been discussed with the patient and family. After consideration of risks, benefits and other options for treatment, the patient has consented to  Procedure(s): GREEN LIGHT LASER TURP (TRANSURETHRAL RESECTION OF PROSTATE (N/A) as a surgical intervention .  The patient's history has been reviewed, patient examined, no change in status, stable for surgery.  I have reviewed the patient's chart and labs.  No hematuria or dysuria. C/o hesitancy and weak stream, split stream. Questions were answered to the patient's satisfaction.  He elects to proceed.    Aniqua Briere

## 2015-07-21 NOTE — Anesthesia Procedure Notes (Signed)
Procedure Name: LMA Insertion Date/Time: 07/21/2015 8:58 AM Performed by: Norva PavlovALLAWAY, Tarique Loveall G Pre-anesthesia Checklist: Patient identified, Emergency Drugs available, Suction available and Patient being monitored Patient Re-evaluated:Patient Re-evaluated prior to inductionOxygen Delivery Method: Circle System Utilized Preoxygenation: Pre-oxygenation with 100% oxygen Intubation Type: IV induction Ventilation: Mask ventilation without difficulty LMA: LMA inserted LMA Size: 4.0 Number of attempts: 1 Airway Equipment and Method: bite block Placement Confirmation: positive ETCO2 Tube secured with: Tape Dental Injury: Teeth and Oropharynx as per pre-operative assessment

## 2015-07-21 NOTE — Discharge Instructions (Signed)
Prostate Laser Surgery, Care After Refer to this sheet in the next few weeks. These instructions provide you with information on caring for yourself after your procedure. Your health care provider may also give you more specific instructions. Your treatment has been planned according to current medical practices, but problems sometimes occur. Call your health care provider if you have any problems or questions after your procedure. WHAT TO EXPECT AFTER THE PROCEDURE  You may notice blood in your urine that could last up to 3 weeks.  After your catheter is removed, you will have burning (especially at the tip of your penis) when you urinate, especially at the end of urination. For the first few weeks after your procedure, you will feel the need to urinate often. HOME CARE INSTRUCTIONS   Do not perform vigorous exercise, especially heavy lifting, for 1 week or as directed by your health care provider.  Avoid sexual activity for 4-6 weeks or as directed by your health care provider.  Do not ride in a car for extended periods for 1 month or as directed by your health care provider.  Do not strain to have a bowel movement. Drink a lot of fluids and and make sure you get enough fiber in your diet.  Drink enough fluids to keep your urine clear or pale yellow. SEEK IMMEDIATE MEDICAL CARE IF:   Your catheter has been removed and you are suddenly unable to urinate.  Your catheter has not been removed, and it develops a blockage.  You start to have blood clots in your urine.  The blood in your urine becomes persistent or gets thick.  Your temperature is greater than 100.50F (38.1C).  You develop chest pains.  You develop shortness of breath.  You develop leg swelling or pain. MAKE SURE YOU:  Understand these instructions.  Will watch your condition.  Will get help right away if you are not doing well or get worse.   This information is not intended to replace advice given to you by  your health care provider. Make sure you discuss any questions you have with your health care provider.    Foley Catheter Care, Adult A Foley catheter is a soft, flexible tube. This tube is placed into your bladder to drain pee (urine). If you go home with this catheter in place, follow the instructions below. TAKING CARE OF THE CATHETER  Wash your hands with soap and water.  Put soap and water on a clean washcloth.  Clean the skin where the tube goes into your body.  Clean away from the tube site.  Never wipe toward the tube.  Clean the area using a circular motion.  Remove all the soap. Pat the area dry with a clean towel. For males, reposition the skin that covers the end of the penis (foreskin).  Attach the tube to your leg with tape or a leg strap. Do not stretch the tube tight. If you are using tape, remove any stickiness left behind by past tape you used.  Keep the drainage bag below your hips. Keep it off the floor.  Check your tube during the day. Make sure it is working and draining. Make sure the tube does not curl, twist, or bend.  Do not pull on the tube or try to take it out. TAKING CARE OF THE DRAINAGE BAGS You will have a large overnight drainage bag and a small leg bag. You may wear the overnight bag any time. Never wear the small bag at  night. Follow the directions below. Emptying the Drainage Bag Empty your drainage bag when it is  - full or at least 2-3 times a day.  Wash your hands with soap and water.  Keep the drainage bag below your hips.  Hold the dirty bag over the toilet or clean container.  Open the pour spout at the bottom of the bag. Empty the pee into the toilet or container. Do not let the pour spout touch anything.  Clean the pour spout with a gauze pad or cotton ball that has rubbing alcohol on it.  Close the pour spout.  Attach the bag to your leg with tape or a leg strap.  Wash your hands well. Changing the Drainage Bag Change your  bag once a month or sooner if it starts to smell or look dirty.   Wash your hands with soap and water.  Pinch the rubber tube so that pee does not spill out.  Disconnect the catheter tube from the drainage tube at the connection valve. Do not let the tubes touch anything.  Clean the end of the catheter tube with an alcohol wipe. Clean the end of a the drainage tube with a different alcohol wipe.  Connect the catheter tube to the drainage tube of the clean drainage bag.  Attach the new bag to the leg with tape or a leg strap. Avoid attaching the new bag too tightly.  Wash your hands well. Cleaning the Drainage Bag  Wash your hands with soap and water.  Wash the bag in warm, soapy water.  Rinse the bag with warm water.  Fill the bag with a mixture of white vinegar and water (1 cup vinegar to 1 quart warm water [.2 liter vinegar to 1 liter warm water]). Close the bag and soak it for 30 minutes in the solution.  Rinse the bag with warm water.  Hang the bag to dry with the pour spout open and hanging downward.  Store the clean bag (once it is dry) in a clean plastic bag.  Wash your hands well. PREVENT INFECTION  Wash your hands before and after touching your tube.  Take showers every day. Wash the skin where the tube enters your body. Do not take baths. Replace wet leg straps with dry ones, if this applies.  Do not use powders, sprays, or lotions on the genital area. Only use creams, lotions, or ointments as told by your doctor.  For females, wipe from front to back after going to the bathroom.  Drink enough fluids to keep your pee clear or pale yellow unless you are told not to have too much fluid (fluid restriction).  Do not let the drainage bag or tubing touch or lie on the floor.  Wear cotton underwear to keep the area dry. GET HELP IF:  Your pee is cloudy or smells unusually bad.  Your tube becomes clogged.  You are not draining pee into the bag or your bladder  feels full.  Your tube starts to leak. GET HELP RIGHT AWAY IF:  You have pain, puffiness (swelling), redness, or yellowish-white fluid (pus) where the tube enters the body.  You have pain in the belly (abdomen), legs, lower back, or bladder.  You have a fever.  You see blood fill the tube, or your pee is pink or red.  You feel sick to your stomach (nauseous), throw up (vomit), or have chills.  Your tube gets pulled out. MAKE SURE YOU:   Understand these instructions.  Will watch your condition.  Will get help right away if you are not doing well or get worse.   This information is not intended to replace advice given to you by your health care provider. Make sure you discuss any questions you have with your health care provider.   Document Released: 11/12/2012 Document Revised: 08/08/2014 Document Reviewed: 11/12/2012 Elsevier Interactive Patient Education 2016 [employer].      Post Anesthesia Home Care Instructions  Activity: Get plenty of rest for the remainder of the day. A responsible adult should stay with you for 24 hours following the procedure.  For the next 24 hours, DO NOT: -Drive a car -Advertising copywriter -Drink alcoholic beverages -Take any medication unless instructed by your physician -Make any legal decisions or sign important papers.  Meals: Start with liquid foods such as gelatin or soup. Progress to regular foods as tolerated. Avoid greasy, spicy, heavy foods. If nausea and/or vomiting occur, drink only clear liquids until the nausea and/or vomiting subsides. Call your physician if vomiting continues.  Special Instructions/Symptoms: Your throat may feel dry or sore from the anesthesia or the breathing tube placed in your throat during surgery. If this causes discomfort, gargle with warm salt water. The discomfort should disappear within 24 hours.  If you had a scopolamine patch placed behind your ear for the management of post- operative nausea  and/or vomiting:  1. The medication in the patch is effective for 72 hours, after which it should be removed.  Wrap patch in a tissue and discard in the trash. Wash hands thoroughly with soap and water. 2. You may remove the patch earlier than 72 hours if you experience unpleasant side effects which may include dry mouth, dizziness or visual disturbances. 3. Avoid touching the patch. Wash your hands with soap and water after contact with the patch.

## 2015-07-21 NOTE — Anesthesia Postprocedure Evaluation (Signed)
Anesthesia Post Note  Patient: Michaela CornerGeorge R Sliwinski Sr.  Procedure(s) Performed: Procedure(s) (LRB): GREEN LIGHT LASER TURP (TRANSURETHRAL RESECTION OF PROSTATE (N/A)  Patient location during evaluation: PACU Anesthesia Type: General Level of consciousness: awake and alert Pain management: pain level controlled Vital Signs Assessment: post-procedure vital signs reviewed and stable Respiratory status: spontaneous breathing, nonlabored ventilation, respiratory function stable and patient connected to nasal cannula oxygen Cardiovascular status: blood pressure returned to baseline and stable Postop Assessment: no signs of nausea or vomiting Anesthetic complications: no    Last Vitals:  Filed Vitals:   07/21/15 1030 07/21/15 1040  BP:    Pulse: 83 83  Temp:    Resp: 14 18    Last Pain: There were no vitals filed for this visit.               Yuvin Bussiere J

## 2015-07-21 NOTE — Transfer of Care (Signed)
Last Vitals:  Filed Vitals:   07/21/15 0726  BP: 140/72  Pulse: 73  Temp: 36.4 C  Resp: 16    Immediate Anesthesia Transfer of Care Note  Patient: Joseph CornerGeorge R Etheridge Sr.  Procedure(s) Performed: Procedure(s) (LRB): GREEN LIGHT LASER TURP (TRANSURETHRAL RESECTION OF PROSTATE (N/A)  Patient Location: PACU  Anesthesia Type: General  Level of Consciousness: awake, alert  and oriented  Airway & Oxygen Therapy: Patient Spontanous Breathing and Patient connected to face mask oxygen  Post-op Assessment: Report given to PACU RN and Post -op Vital signs reviewed and stable  Post vital signs: Reviewed and stable  Complications: No apparent anesthesia complications

## 2015-07-22 ENCOUNTER — Encounter (HOSPITAL_BASED_OUTPATIENT_CLINIC_OR_DEPARTMENT_OTHER): Payer: Self-pay | Admitting: Urology

## 2015-07-22 LAB — POCT HEMOGLOBIN-HEMACUE: Hemoglobin: 14.2 g/dL (ref 13.0–17.0)

## 2015-07-24 ENCOUNTER — Encounter: Payer: Self-pay | Admitting: Internal Medicine

## 2015-07-29 ENCOUNTER — Encounter: Payer: Self-pay | Admitting: *Deleted

## 2015-08-19 ENCOUNTER — Encounter: Payer: Medicare Other | Admitting: Internal Medicine

## 2015-08-19 ENCOUNTER — Encounter: Payer: Self-pay | Admitting: Internal Medicine

## 2015-08-19 ENCOUNTER — Ambulatory Visit (INDEPENDENT_AMBULATORY_CARE_PROVIDER_SITE_OTHER): Payer: Medicare Other | Admitting: Internal Medicine

## 2015-08-19 VITALS — BP 129/73 | HR 75 | Temp 98.6°F | Ht 71.0 in | Wt 190.2 lb

## 2015-08-19 DIAGNOSIS — E785 Hyperlipidemia, unspecified: Secondary | ICD-10-CM | POA: Diagnosis not present

## 2015-08-19 DIAGNOSIS — E0590 Thyrotoxicosis, unspecified without thyrotoxic crisis or storm: Secondary | ICD-10-CM | POA: Diagnosis not present

## 2015-08-19 DIAGNOSIS — Z0000 Encounter for general adult medical examination without abnormal findings: Secondary | ICD-10-CM | POA: Diagnosis not present

## 2015-08-19 LAB — LIPID PANEL
CHOLESTEROL: 193 mg/dL (ref 0–200)
HDL: 47.4 mg/dL (ref >39.00)
LDL Cholesterol: 130 mg/dL — ABNORMAL HIGH (ref 0–99)
NonHDL: 146
TRIGLYCERIDES: 80 mg/dL (ref 0.0–149.0)
Total CHOL/HDL Ratio: 4
VLDL: 16 mg/dL (ref 0.0–40.0)

## 2015-08-19 LAB — COMPREHENSIVE METABOLIC PANEL
ALBUMIN: 4.1 g/dL (ref 3.5–5.2)
ALK PHOS: 72 U/L (ref 39–117)
ALT: 17 U/L (ref 0–53)
AST: 21 U/L (ref 0–37)
BUN: 19 mg/dL (ref 6–23)
CALCIUM: 9.7 mg/dL (ref 8.4–10.5)
CHLORIDE: 108 meq/L (ref 96–112)
CO2: 28 mEq/L (ref 19–32)
Creatinine, Ser: 0.86 mg/dL (ref 0.40–1.50)
GFR: 92.52 mL/min (ref >60.00)
Glucose, Bld: 100 mg/dL — ABNORMAL HIGH (ref 70–99)
POTASSIUM: 4.5 meq/L (ref 3.5–5.1)
Sodium: 143 mEq/L (ref 135–145)
TOTAL PROTEIN: 7.5 g/dL (ref 6.0–8.3)
Total Bilirubin: 0.6 mg/dL (ref 0.2–1.2)

## 2015-08-19 LAB — TSH: TSH: 0.25 u[IU]/mL — AB (ref 0.35–4.50)

## 2015-08-19 MED ORDER — PAROXETINE HCL 20 MG PO TABS: 20.0000 mg | ORAL_TABLET | Freq: Every day | ORAL | Status: DC

## 2015-08-19 MED ORDER — CIPROFLOXACIN HCL 500 MG PO TABS: 500.0000 mg | ORAL_TABLET | Freq: Two times a day (BID) | ORAL | Status: DC

## 2015-08-19 NOTE — Progress Notes (Signed)
Subjective:    Patient ID: Joseph FLEEK Sr., male    DOB: April 08, 1941, 75 y.o.   MRN: [id number]  HPI  The patient is here for annual Medicare Wellness Examination and management of other chronic and acute problems.   The risk factors are reflected in the history.  The roster of all physicians providing medical care to patient - is listed in the Snapshot section of the chart.  Activities of daily living:   The patient is 100% independent in all ADLs: dressing, toileting, feeding as well as independent mobility. Patient lives with wife. Has a dog and 3 cats. Home is 2 stories. Has carpeted floors and hard floors in kitchen. Some decreased functional capacity with increased shortness of breath with exertion. Requests handicap sticker.  Home safety :  The patient has smoke and CO detectors in the home.  They wear seatbelts in their car. There are no firearms at home.  There is no violence in the home. They feel safe where they live.  Infectious Risks: There is no risks for hepatitis, STDs or HIV.  There is no  history of blood transfusion.  They have no travel history to infectious disease endemic areas of the world.  Additional Health Care Providers: The patient has seen their dentist in the last six months. Dentist - Dr. Nancy Marus They have seen their eye doctor in the last year. Follow up scheduled.- Dr. Donnetta Hutching They deny hearing issues. They have deferred audiologic testing in the last year.   They do not  have excessive sun exposure. Discussed the need for sun protection: hats,long sleeves and use of sunscreen if there is significant sun exposure.  Dermatologist - none  Pulmonologist - Dr. Meredeth Ide  Diet: the importance of a healthy diet is discussed. They do have a healthy diet.  The benefits of regular aerobic exercise were discussed. Exercise has been limited recently. Typically does yard work for exercise. Has some dyspnea chronically with heavy lifting.  Depression  screen: there are no signs or vegative symptoms of depression- irritability, change in appetite, anhedonia, sadness/tearfullness.  Cognitive assessment: the patient manages all their financial and personal affairs and is actively engaged. They could relate day,date,year and events.   HCPOA - wife, Joseph Benson   The following portions of the patient's history were reviewed and updated as appropriate: allergies, current medications, past family history, past medical history,  past surgical history, past social history and problem list.  Visual acuity was not assessed per patient preference as they have regular follow up with their ophthalmologist. Hearing and body mass index were assessed and reviewed.   COPD: Has follow up scheduled with Dr. Meredeth Ide.  BPH : Had prostate surgery in December. On Flomax. Urine flow is improved, however having some urgency. Occasional incontinence of urine after surgery. Notes some blood in urine. Plans to follow up with urology.   Wt Readings from Last 3 Encounters:  08/19/15 190 lb 4 oz (86.297 kg)  07/21/15 188 lb 8 oz (85.503 kg)  07/31/14 189 lb 4 oz (85.843 kg)   BP Readings from Last 3 Encounters:  08/19/15 129/73  07/21/15 146/79  07/31/14 119/73    Past Medical History  Diagnosis Date  . 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 COPD (chronic obstructive pulmonary disease) (HCC)     PULMOLOGIST -- Dr. Meredeth Ide.  [facility])  . BPH (benign prostatic hyperplasia)   . Bladder outlet obstruction   . History  of exercise stress test     06-26-2009  w/ echo--  normal ETT and stress echo images, no ishemia, ef 65%  . Weak urine stream   . Urinary hesitancy   . Lower urinary tract symptoms (LUTS)   . Elevated PSA   . GERD (gastroesophageal reflux disease)   . Wears glasses   . History of [facility] spotted fever     Apr 2015--  no residual   Family History  Problem Relation Age of Onset  . Cancer Mother      liver  . COPD Sister    Past Surgical History  Procedure Laterality Date  . Tonsillectomy  as child  . Green light laser turp (transurethral resection of prostate N/A 07/21/2015    Procedure: GREEN LIGHT LASER TURP (TRANSURETHRAL RESECTION OF PROSTATE;  Surgeon: Jerilee Field, MD;  Location: [facility];  Service: Urology;  Laterality: N/A;   Social History   Social History  . Marital Status: Married    Spouse Name: N/A  . Number of Children: N/A  . Years of Education: N/A   Social History Main Topics  . Smoking status: Former Smoker -- 30 years    Types: Cigarettes    Quit date: 04/26/2000  . Smokeless tobacco: Never Used  . Alcohol Use: Yes     Comment: occasional  . Drug Use: No  . Sexual Activity: Not Asked   Other Topics Concern  . None   Social History Narrative    Review of Systems  Constitutional: Negative for fever, chills, activity change, appetite change, fatigue and unexpected weight change.  HENT: Negative for congestion.   Eyes: Negative for visual disturbance.  Respiratory: Positive for shortness of breath (with exertion). Negative for cough.   Cardiovascular: Negative for chest pain, palpitations and leg swelling.  Gastrointestinal: Negative for nausea, vomiting, abdominal pain, diarrhea, constipation and abdominal distention.  Genitourinary: Positive for urgency, frequency and hematuria. Negative for dysuria and difficulty urinating.  Musculoskeletal: Negative for arthralgias and gait problem.  Skin: Negative for color change and rash.  Hematological: Negative for adenopathy.  Psychiatric/Behavioral: Positive for dysphoric mood (noted by wife). Negative for sleep disturbance. The patient is not nervous/anxious.        Objective:    BP 129/73 mmHg  Pulse 75  Temp(Src) 98.6 F (37 C) (Oral)  Ht  (1.803 m)  Wt 190 lb 4 oz (86.297 kg)  BMI 26.55 kg/m2  SpO2 96% Physical Exam  Constitutional: He is oriented to person,  place, and time. He appears well-developed and well-nourished. No distress.  HENT:  Head: Normocephalic and atraumatic.  Right Ear: External ear normal.  Left Ear: External ear normal.  Nose: Nose normal.  Mouth/Throat: Oropharynx is clear and moist. No oropharyngeal exudate.  Eyes: Conjunctivae and EOM are normal. Pupils are equal, round, and reactive to light. Right eye exhibits no discharge. Left eye exhibits no discharge. No scleral icterus.  Neck: Normal range of motion. Neck supple. No tracheal deviation present. No thyromegaly present.  Cardiovascular: Normal rate, regular rhythm and normal heart sounds.  Exam reveals no gallop and no friction rub.   No murmur heard. Pulmonary/Chest: Effort normal. No accessory muscle usage. No tachypnea. No respiratory distress. He has decreased breath sounds (prolonged expiration). He has no wheezes. He has no rhonchi. He has no rales. He exhibits no tenderness.  Abdominal: Soft. Bowel sounds are normal. He exhibits no distension and no mass. There is no tenderness. There is no rebound and  no guarding.  Musculoskeletal: Normal range of motion. He exhibits no edema.  Lymphadenopathy:    He has no cervical adenopathy.  Neurological: He is alert and oriented to person, place, and time. No cranial nerve deficit. Coordination normal.  Skin: Skin is warm and dry. No rash noted. He is not diaphoretic. No erythema. No pallor.  Psychiatric: He has a normal mood and affect. His behavior is normal. Judgment and thought content normal.          Assessment & Plan:  Patient was given a handout regarding current recommendations for health maintenance and preventative care on the AVS.  Problem List Items Addressed This Visit      Unprioritized   Hyperlipidemia   Relevant Medications   terazosin (HYTRIN) 5 MG capsule   Other Relevant Orders   Comprehensive metabolic panel   Lipid panel   Medicare annual wellness visit, subsequent - Primary    General  medical exam normal today. Colonoscopy UTD and reviewed. Immunizations UTD. Labs today including TSH,CMP, lipids.Encouraged healthy diet and exercise.        Subclinical hyperthyroidism   Relevant Orders   TSH       Return in about 6 months (around 02/16/2016) for Recheck.

## 2015-08-19 NOTE — Assessment & Plan Note (Signed)
General medical exam normal today. Colonoscopy UTD and reviewed. Immunizations UTD. Labs today including TSH,CMP, lipids.Encouraged healthy diet and exercise.

## 2015-08-19 NOTE — Progress Notes (Signed)
Pre visit review using our clinic review tool, if applicable. No additional management support is needed unless otherwise documented below in the visit note. 

## 2015-08-19 NOTE — Patient Instructions (Signed)

## 2015-08-26 DIAGNOSIS — H25813 Combined forms of age-related cataract, bilateral: Secondary | ICD-10-CM | POA: Diagnosis not present

## 2015-08-26 DIAGNOSIS — H524 Presbyopia: Secondary | ICD-10-CM | POA: Diagnosis not present

## 2015-08-26 DIAGNOSIS — H5213 Myopia, bilateral: Secondary | ICD-10-CM | POA: Diagnosis not present

## 2015-08-26 DIAGNOSIS — H52223 Regular astigmatism, bilateral: Secondary | ICD-10-CM | POA: Diagnosis not present

## 2015-09-02 DIAGNOSIS — J439 Emphysema, unspecified: Secondary | ICD-10-CM | POA: Diagnosis not present

## 2015-10-20 DIAGNOSIS — Z0000 Encounter for general adult medical examination without abnormal findings: Secondary | ICD-10-CM | POA: Diagnosis not present

## 2015-10-20 DIAGNOSIS — R3915 Urgency of urination: Secondary | ICD-10-CM | POA: Diagnosis not present

## 2015-10-20 DIAGNOSIS — N401 Enlarged prostate with lower urinary tract symptoms: Secondary | ICD-10-CM | POA: Diagnosis not present

## 2016-02-16 ENCOUNTER — Encounter: Payer: Self-pay | Admitting: Internal Medicine

## 2016-02-16 ENCOUNTER — Ambulatory Visit (INDEPENDENT_AMBULATORY_CARE_PROVIDER_SITE_OTHER): Payer: Medicare Other | Admitting: Internal Medicine

## 2016-02-16 VITALS — BP 144/78 | HR 63 | Wt 192.2 lb

## 2016-02-16 DIAGNOSIS — E0590 Thyrotoxicosis, unspecified without thyrotoxic crisis or storm: Secondary | ICD-10-CM | POA: Diagnosis not present

## 2016-02-16 DIAGNOSIS — E785 Hyperlipidemia, unspecified: Secondary | ICD-10-CM | POA: Diagnosis not present

## 2016-02-16 DIAGNOSIS — J449 Chronic obstructive pulmonary disease, unspecified: Secondary | ICD-10-CM | POA: Diagnosis not present

## 2016-02-16 LAB — COMPREHENSIVE METABOLIC PANEL
ALBUMIN: 4.1 g/dL (ref 3.5–5.2)
ALK PHOS: 61 U/L (ref 39–117)
ALT: 15 U/L (ref 0–53)
AST: 18 U/L (ref 0–37)
BILIRUBIN TOTAL: 0.9 mg/dL (ref 0.2–1.2)
BUN: 16 mg/dL (ref 6–23)
CO2: 28 mEq/L (ref 19–32)
Calcium: 9.5 mg/dL (ref 8.4–10.5)
Chloride: 105 mEq/L (ref 96–112)
Creatinine, Ser: 0.88 mg/dL (ref 0.40–1.50)
GFR: 89.97 mL/min (ref >60.00)
Glucose, Bld: 88 mg/dL (ref 70–99)
POTASSIUM: 4.2 meq/L (ref 3.5–5.1)
SODIUM: 139 meq/L (ref 135–145)
TOTAL PROTEIN: 7 g/dL (ref 6.0–8.3)

## 2016-02-16 LAB — TSH: TSH: 0.47 u[IU]/mL (ref 0.35–4.50)

## 2016-02-16 LAB — T4, FREE: Free T4: 0.75 ng/dL (ref 0.60–1.60)

## 2016-02-16 NOTE — Progress Notes (Signed)
Pre visit review using our clinic review tool, if applicable. No additional management support is needed unless otherwise documented below in the visit note. 

## 2016-02-16 NOTE — Assessment & Plan Note (Signed)
Repeat TSH and Free T4 with labs today.

## 2016-02-16 NOTE — Progress Notes (Signed)
Subjective:    Patient ID: Joseph CornerGeorge R Heidelberger Sr., male    DOB: [birth year], 75 y.o.   MRN: [id number]  HPI  75YO male presents for follow up.  Feeling well. [place] went [place] Considering living there.  COPD - No recent exacerbations. Occasional dyspnea in hotter weather. Responds to Albuterol.   Wt Readings from Last 3 Encounters:  02/16/16 192 lb 3.2 oz (87.181 kg)  08/19/15 190 lb 4 oz (86.297 kg)  07/21/15 188 lb 8 oz (85.503 kg)   BP Readings from Last 3 Encounters:  02/16/16 144/78  08/19/15 129/73  07/21/15 146/79    Past Medical History  Diagnosis Date  . 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 COPD (chronic obstructive pulmonary disease) (HCC)     PULMOLOGIST -- [name].  [facility])  . BPH (benign prostatic hyperplasia)   . Bladder outlet obstruction   . History of exercise stress test     06-26-2009  w/ echo--  normal ETT and stress echo images, no ishemia, ef 65%  . Weak urine stream   . Urinary hesitancy   . Lower urinary tract symptoms (LUTS)   . Elevated PSA   . GERD (gastroesophageal reflux disease)   . Wears glasses   . [place]     Apr 2015--  no residual   Family History  Problem Relation Age of Onset  . Cancer Mother     liver  . COPD Sister    Past Surgical History  Procedure Laterality Date  . Tonsillectomy  as child  . Green light laser turp (transurethral resection of prostate N/A 07/21/2015    Procedure: GREEN LIGHT LASER TURP (TRANSURETHRAL RESECTION OF PROSTATE;  Surgeon: [name], MD;  [facility];  Service: Urology;  Laterality: N/A;   Social History   Social History  . Marital Status: Married    Spouse Name: N/A  . Number of Children: N/A  . Years of Education: N/A   Social History Main Topics  . Smoking status: Former Smoker -- 30 years    Types: Cigarettes    Quit date: 04/26/2000  . Smokeless tobacco: Never Used    . Alcohol Use: Yes     Comment: occasional  . Drug Use: No  . Sexual Activity: Not Asked   Other Topics Concern  . None   Social History Narrative    Review of Systems  Constitutional: Negative for fever, chills, activity change, appetite change, fatigue and unexpected weight change.  Eyes: Negative for visual disturbance.  Respiratory: Negative for cough and shortness of breath.   Cardiovascular: Negative for chest pain, palpitations and leg swelling.  Gastrointestinal: Negative for nausea, vomiting, abdominal pain, diarrhea, constipation and abdominal distention.  Genitourinary: Negative for dysuria, urgency and difficulty urinating.  Musculoskeletal: Negative for arthralgias and gait problem.  Skin: Negative for color change and rash.  Hematological: Negative for adenopathy.  Psychiatric/Behavioral: Negative for suicidal ideas, sleep disturbance and dysphoric mood. The patient is not nervous/anxious.        Objective:    BP 144/78 mmHg  Pulse 63  Wt 192 lb 3.2 oz (87.181 kg)  SpO2 96% Physical Exam  Constitutional: He is oriented to person, place, and time. He appears well-developed and well-nourished. No distress.  HENT:  Head: Normocephalic and atraumatic.  Right Ear: External ear normal.  Left Ear: External ear normal.  Nose: Nose normal.  Mouth/Throat: Oropharynx  is clear and moist. No oropharyngeal exudate.  Eyes: Conjunctivae and EOM are normal. Pupils are equal, round, and reactive to light. Right eye exhibits no discharge. Left eye exhibits no discharge. No scleral icterus.  Neck: Normal range of motion. Neck supple. No tracheal deviation present. No thyromegaly present.  Cardiovascular: Normal rate, regular rhythm and normal heart sounds.  Exam reveals no gallop and no friction rub.   No murmur heard. Pulmonary/Chest: Effort normal and breath sounds normal. No accessory muscle usage. No tachypnea. No respiratory distress. He has no decreased breath sounds. He  has no wheezes. He has no rhonchi. He has no rales. He exhibits no tenderness.  Musculoskeletal: Normal range of motion. He exhibits no edema.  Lymphadenopathy:    He has no cervical adenopathy.  Neurological: He is alert and oriented to person, place, and time. No cranial nerve deficit. Coordination normal.  Skin: Skin is warm and dry. No rash noted. He is not diaphoretic. No erythema. No pallor.  Psychiatric: He has a normal mood and affect. His behavior is normal. Judgment and thought content normal.          Assessment & Plan:   Problem List Items Addressed This Visit      Unprioritized   COPD (chronic obstructive pulmonary disease) (HCC) - Primary (Chronic)    Symptomatically, doing well. Will continue Symbicort and prn Albuterol.      Hyperlipidemia (Chronic)   Relevant Orders   Comprehensive metabolic panel   Subclinical hyperthyroidism (Chronic)    Repeat TSH and Free T4 with labs today.      Relevant Orders   T4, free   TSH       Return in about 3 months (around 05/18/2016) for New Patient.  Ronna Polio, MD Internal Medicine [facility]

## 2016-02-16 NOTE — Assessment & Plan Note (Signed)
Symptomatically, doing well. Will continue Symbicort and prn Albuterol.

## 2016-02-16 NOTE — Patient Instructions (Signed)
Labs today.   Follow up in 3 months.  

## 2016-03-02 DIAGNOSIS — J439 Emphysema, unspecified: Secondary | ICD-10-CM | POA: Diagnosis not present

## 2016-05-12 ENCOUNTER — Other Ambulatory Visit: Payer: Self-pay

## 2016-05-12 MED ORDER — PAROXETINE HCL 20 MG PO TABS: 20.0000 mg | ORAL_TABLET | Freq: Every day | ORAL | 0 refills | Status: DC

## 2016-05-12 NOTE — Telephone Encounter (Signed)
Medication has been refilled.

## 2016-05-18 ENCOUNTER — Encounter: Payer: Self-pay | Admitting: Family

## 2016-05-18 ENCOUNTER — Ambulatory Visit (INDEPENDENT_AMBULATORY_CARE_PROVIDER_SITE_OTHER): Payer: Medicare Other | Admitting: Family

## 2016-05-18 VITALS — BP 120/76 | HR 77 | Temp 97.8°F | Ht 71.0 in | Wt 189.2 lb

## 2016-05-18 DIAGNOSIS — J449 Chronic obstructive pulmonary disease, unspecified: Secondary | ICD-10-CM

## 2016-05-18 DIAGNOSIS — E0590 Thyrotoxicosis, unspecified without thyrotoxic crisis or storm: Secondary | ICD-10-CM | POA: Diagnosis not present

## 2016-05-18 DIAGNOSIS — E039 Hypothyroidism, unspecified: Secondary | ICD-10-CM

## 2016-05-18 DIAGNOSIS — F3342 Major depressive disorder, recurrent, in full remission: Secondary | ICD-10-CM | POA: Diagnosis not present

## 2016-05-18 DIAGNOSIS — Z23 Encounter for immunization: Secondary | ICD-10-CM | POA: Diagnosis not present

## 2016-05-18 DIAGNOSIS — E038 Other specified hypothyroidism: Secondary | ICD-10-CM

## 2016-05-18 LAB — TSH: TSH: 0.36 u[IU]/mL (ref 0.35–4.50)

## 2016-05-18 LAB — T4, FREE: Free T4: 0.81 ng/dL (ref 0.60–1.60)

## 2016-05-18 NOTE — Progress Notes (Signed)
Subjective:    Patient ID: Joseph CornerGeorge R Few Sr., male    DOB: [birth year], 75 y.o.   MRN: [id number]  CC: Joseph CornerGeorge R Hiltz Sr. is a 75 y.o. male who presents today for follow up.   HPI: patient here to establish care and follow-up. Accompanied by wife.   COPD- Follows with Dr Meredeth IdeFleming. Long time smoker and quit with first grandchild 16 years ago. Wife 'sees COPD' and tries to anticipate it. With exertion, he becomes SOB.   HTN- Compliant with medications. Denies exertional chest pain or pressure, numbness or tingling radiating to left arm or jaw, palpitations, dizziness, frequent headaches, changes in vision, or shortness of breath.   Depression- Has been on paxil  for years. Stable.     HISTORY:  Past Medical History:  Diagnosis Date  . Bladder outlet obstruction   . BPH (benign prostatic hyperplasia)   . COPD (chronic obstructive pulmonary disease) (HCC)    PULMOLOGIST -- [name].  [facility])  . Elevated PSA   . GERD (gastroesophageal reflux disease)   . History of exercise stress test    06-26-2009  w/ echo--  normal ETT and stress echo images, no ishemia, ef 65%  . [facility]    Apr 2015--  no residual  . Lower urinary tract symptoms (LUTS)   . 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 Urinary hesitancy   . Weak urine stream   . Wears glasses    Past Surgical History:  Procedure Laterality Date  . GREEN LIGHT LASER TURP (TRANSURETHRAL RESECTION OF PROSTATE N/A 07/21/2015   Procedure: GREEN LIGHT LASER TURP (TRANSURETHRAL RESECTION OF PROSTATE;  Surgeon: [name], MD;  [facility];  Service: Urology;  Laterality: N/A;  . TONSILLECTOMY  as child   Family History  Problem Relation Age of Onset  . Cancer Mother     liver  . COPD Sister     Allergies: Spiriva [tiotropium bromide monohydrate] Current Outpatient Prescriptions on File Prior to Visit  Medication Sig Dispense  Refill  . albuterol (PROVENTIL HFA;VENTOLIN HFA) 108 (90 BASE) MCG/ACT inhaler Inhale 2 puffs into the lungs every 4 (four) hours as needed. 18 g 3  . budesonide-formoterol (SYMBICORT) 160-4.5 MCG/ACT inhaler Inhale 2 puffs into the lungs 2 (two) times daily. 1 Inhaler 3  . cetirizine (ZYRTEC) 10 MG tablet Take 10 mg by mouth at bedtime.     . docusate sodium (COLACE) 100 MG capsule Take 100 mg by mouth every evening.    Marland Kitchen. glucosamine-chondroitin 500-400 MG tablet Take 1 tablet by mouth 3 (three) times daily.    Marland Kitchen. omeprazole (PRILOSEC) 20 MG capsule Take 20 mg by mouth as needed.    Marland Kitchen. PARoxetine (PAXIL) 20 MG tablet Take 1 tablet (20 mg total) by mouth daily. 90 tablet 0   No current facility-administered medications on file prior to visit.     Social History  Substance Use Topics  . Smoking status: Former Smoker    Years: 30.00    Types: Cigarettes    Quit date: 04/26/2000  . Smokeless tobacco: Never Used  . Alcohol use Yes     Comment: occasional    Review of Systems  Constitutional: Negative for chills and fever.  HENT: Negative for congestion, ear pain, rhinorrhea, sinus pressure and sore throat.   Respiratory: Negative for cough, shortness of breath and wheezing.   Cardiovascular: Negative for chest pain and  palpitations.  Gastrointestinal: Negative for diarrhea, nausea and vomiting.  Genitourinary: Negative for dysuria.  Musculoskeletal: Negative for myalgias.  Skin: Negative for rash.  Neurological: Negative for headaches.  Hematological: Negative for adenopathy.      Objective:    BP 120/76   Pulse 77   Temp 97.8 F (36.6 C) (Oral)   Ht 5\' 11"  (1.803 m)   Wt 189 lb 3.2 oz (85.8 kg)   SpO2 95%   BMI 26.39 kg/m  BP Readings from Last 3 Encounters:  05/18/16 120/76  02/16/16 (!) 144/78  08/19/15 129/73   Wt Readings from Last 3 Encounters:  05/18/16 189 lb 3.2 oz (85.8 kg)  02/16/16 192 lb 3.2 oz (87.2 kg)  08/19/15 190 lb 4 oz (86.3 kg)    Physical Exam    Constitutional: He appears well-developed and well-nourished.  Neck: No thyroid mass and no thyromegaly present.  Cardiovascular: Regular rhythm and normal heart sounds.   Pulmonary/Chest: Effort normal and breath sounds normal. No respiratory distress. He has no wheezes. He has no rhonchi. He has no rales.  Lymphadenopathy:       Head (right side): No submental, no submandibular, no tonsillar, no preauricular, no posterior auricular and no occipital adenopathy present.       Head (left side): No submental, no submandibular, no tonsillar, no preauricular, no posterior auricular and no occipital adenopathy present.    He has no cervical adenopathy.    He has no axillary adenopathy.  Neurological: He is alert.  Skin: Skin is warm and dry.  Psychiatric: He has a normal mood and affect. His speech is normal and behavior is normal.  Vitals reviewed.      Assessment & Plan:   Problem List Items Addressed This Visit      Respiratory   COPD (chronic obstructive pulmonary disease) (HCC) - Primary (Chronic)    Stable. Follows with pulmonology, Dr Mayo Ao..Former smoker of 40 years. Patient quit several years ago. Low-dose CT chest ordered.      Relevant Orders   CT CHEST LUNG CANCER SCREENING LOW DOSE WO CONTRAST     Endocrine   Subclinical hyperthyroidism (Chronic)    Subclinical. No symptoms. Pending TSH.        Other   Depression (Chronic)    Stable on Paxil. Continue current regimen.       Other Visit Diagnoses    Encounter for immunization       Relevant Orders   Flu vaccine HIGH DOSE PF (Completed)   TSH   T4, free   Subclinical hypothyroidism       Relevant Orders   TSH   T4, free       I am having Mr. Mells maintain his cetirizine, budesonide-formoterol, albuterol, glucosamine-chondroitin, docusate sodium, omeprazole, and PARoxetine.   No orders of the defined types were placed in this encounter.   Return precautions given.   Risks, benefits, and  alternatives of the medications and treatment plan prescribed today were discussed, and patient expressed understanding.   Education regarding symptom management and diagnosis given to patient on AVS.  Continue to follow with Rennie Plowman, FNP for routine health maintenance.   Joseph Corner Sr. and I agreed with plan.   Rennie Plowman, FNP

## 2016-05-18 NOTE — Assessment & Plan Note (Signed)
Stable on Paxil. Continue current regimen.

## 2016-05-18 NOTE — Progress Notes (Signed)
Pre visit review using our clinic review tool, if applicable. No additional management support is needed unless otherwise documented below in the visit note. 

## 2016-05-18 NOTE — Assessment & Plan Note (Signed)
Stable. Follows with pulmonology, Dr Mayo AoFlemming..Former smoker of 40 years. Patient quit several years ago. Low-dose CT chest ordered.

## 2016-05-18 NOTE — Patient Instructions (Signed)
Low dose CT scan.   TSH lab.   Pleasure seeing you.

## 2016-05-18 NOTE — Assessment & Plan Note (Signed)
Subclinical. No symptoms. Pending TSH.

## 2016-05-25 ENCOUNTER — Telehealth: Payer: Self-pay | Admitting: *Deleted

## 2016-05-25 NOTE — Telephone Encounter (Signed)
Received referral for initial lung cancer screening scan. Contacted patient's spouse who confirmed that patient stopped smoking in 2001. Informed that with smoking cessation >15 years ago, patient is no longer a candidate for lung cancer screening. Discussed decreased risk of lung cancer after 15 years as well as importance of notification of PCP of any s/s of lung cancer. Verbalized understanding.

## 2016-06-14 ENCOUNTER — Ambulatory Visit: Payer: Medicare Other

## 2016-07-04 ENCOUNTER — Telehealth: Payer: Self-pay | Admitting: Family

## 2016-07-04 NOTE — Telephone Encounter (Signed)
Pt declined to get the AWV. Thank you! °

## 2016-08-22 ENCOUNTER — Encounter: Payer: Self-pay | Admitting: Family

## 2016-08-23 ENCOUNTER — Telehealth: Payer: Self-pay | Admitting: Family

## 2016-08-23 DIAGNOSIS — H9190 Unspecified hearing loss, unspecified ear: Secondary | ICD-10-CM

## 2016-08-23 NOTE — Telephone Encounter (Signed)
Referral ent 

## 2016-09-06 DIAGNOSIS — J439 Emphysema, unspecified: Secondary | ICD-10-CM | POA: Diagnosis not present

## 2016-09-06 DIAGNOSIS — J449 Chronic obstructive pulmonary disease, unspecified: Secondary | ICD-10-CM | POA: Diagnosis not present

## 2016-09-07 DIAGNOSIS — H903 Sensorineural hearing loss, bilateral: Secondary | ICD-10-CM | POA: Diagnosis not present

## 2016-09-12 DIAGNOSIS — H04203 Unspecified epiphora, bilateral lacrimal glands: Secondary | ICD-10-CM | POA: Diagnosis not present

## 2016-09-12 DIAGNOSIS — H25813 Combined forms of age-related cataract, bilateral: Secondary | ICD-10-CM | POA: Diagnosis not present

## 2016-09-12 DIAGNOSIS — Z0100 Encounter for examination of eyes and vision without abnormal findings: Secondary | ICD-10-CM | POA: Diagnosis not present

## 2016-09-12 DIAGNOSIS — H524 Presbyopia: Secondary | ICD-10-CM | POA: Diagnosis not present

## 2016-09-12 DIAGNOSIS — H04123 Dry eye syndrome of bilateral lacrimal glands: Secondary | ICD-10-CM | POA: Diagnosis not present

## 2016-09-19 ENCOUNTER — Encounter: Payer: Self-pay | Admitting: Family

## 2016-09-19 ENCOUNTER — Ambulatory Visit (INDEPENDENT_AMBULATORY_CARE_PROVIDER_SITE_OTHER): Payer: Medicare HMO | Admitting: Family

## 2016-09-19 ENCOUNTER — Ambulatory Visit (INDEPENDENT_AMBULATORY_CARE_PROVIDER_SITE_OTHER): Payer: Medicare HMO

## 2016-09-19 VITALS — BP 146/76 | HR 88 | Temp 99.0°F | Ht 71.0 in | Wt 190.6 lb

## 2016-09-19 DIAGNOSIS — J111 Influenza due to unidentified influenza virus with other respiratory manifestations: Secondary | ICD-10-CM | POA: Diagnosis not present

## 2016-09-19 DIAGNOSIS — R0989 Other specified symptoms and signs involving the circulatory and respiratory systems: Secondary | ICD-10-CM | POA: Diagnosis not present

## 2016-09-19 LAB — POC INFLUENZA A&B (BINAX/QUICKVUE)
Influenza A, POC: POSITIVE — AB
Influenza B, POC: POSITIVE — AB

## 2016-09-19 MED ORDER — OSELTAMIVIR PHOSPHATE 75 MG PO CAPS: 75.0000 mg | ORAL_CAPSULE | Freq: Two times a day (BID) | ORAL | 0 refills | Status: DC

## 2016-09-19 NOTE — Progress Notes (Signed)
Subjective:    Patient ID: Joseph HASHEM Sr., male    DOB: 12-Jun-1941, 76 y.o.   MRN: [id number]  CC: Joseph DILLION Sr. is a 76 y.o. male who presents today for an acute visit.    HPI: CC: cough week, worsening. Fever and chills started yesteday, Tmax today 101. Taken tyleonol.   Endorses wheezing, SOB.   No CP, sinus pain, ear pain, sore thoat.  Using symbicort.   H/o COPD; follows with  Dr Mayo Ao        HISTORY:  Past Medical History:  Diagnosis Date  . Bladder outlet obstruction   . BPH (benign prostatic hyperplasia)   . COPD (chronic obstructive pulmonary disease) (HCC)    PULMOLOGIST -- Dr. Meredeth Ide.  [facility])  . Elevated PSA   . GERD (gastroesophageal reflux disease)   . History of exercise stress test    06-26-2009  w/ echo--  normal ETT and stress echo images, no ishemia, ef 65%  . [facility]    Apr 2015--  no residual  . Lower urinary tract symptoms (LUTS)   . 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 Urinary hesitancy   . Weak urine stream   . Wears glasses    Past Surgical History:  Procedure Laterality Date  . GREEN LIGHT LASER TURP (TRANSURETHRAL RESECTION OF PROSTATE N/A 07/21/2015   Procedure: GREEN LIGHT LASER TURP (TRANSURETHRAL RESECTION OF PROSTATE;  Surgeon: Jerilee Field, MD;  Location: [facility];  Service: Urology;  Laterality: N/A;  . TONSILLECTOMY  as child   Family History  Problem Relation Age of Onset  . Cancer Mother     liver  . COPD Sister     Allergies: Spiriva [tiotropium bromide monohydrate] Current Outpatient Prescriptions on File Prior to Visit  Medication Sig Dispense Refill  . albuterol (PROVENTIL HFA;VENTOLIN HFA) 108 (90 BASE) MCG/ACT inhaler Inhale 2 puffs into the lungs every 4 (four) hours as needed. 18 g 3  . budesonide-formoterol (SYMBICORT) 160-4.5 MCG/ACT inhaler Inhale 2 puffs into the lungs 2 (two) times daily. 1 Inhaler 3  .  cetirizine (ZYRTEC) 10 MG tablet Take 10 mg by mouth at bedtime.     . docusate sodium (COLACE) 100 MG capsule Take 100 mg by mouth every evening.    Marland Kitchen glucosamine-chondroitin 500-400 MG tablet Take 1 tablet by mouth 3 (three) times daily.    Marland Kitchen omeprazole (PRILOSEC) 20 MG capsule Take 20 mg by mouth as needed.    Marland Kitchen PARoxetine (PAXIL) 20 MG tablet Take 1 tablet (20 mg total) by mouth daily. 90 tablet 0   No current facility-administered medications on file prior to visit.     Social History  Substance Use Topics  . Smoking status: Former Smoker    Years: 30.00    Types: Cigarettes    Quit date: 04/26/2000  . Smokeless tobacco: Never Used  . Alcohol use Yes     Comment: occasional    Review of Systems  Constitutional: Positive for fever. Negative for chills.  HENT: Negative for congestion, sinus pain, sinus pressure and sore throat.   Respiratory: Positive for cough, shortness of breath and wheezing.   Cardiovascular: Negative for chest pain and palpitations.  Gastrointestinal: Negative for nausea and vomiting.  Neurological: Negative for headaches.      Objective:    BP (!) 146/76   Pulse 88   Temp 99 F (37.2 C) (Oral)  Ht 5\' 11"  (1.803 m)   Wt 190 lb 9.6 oz (86.5 kg)   SpO2 96%   BMI 26.58 kg/m    Physical Exam  Constitutional: Vital signs are normal. He appears well-developed and well-nourished.  HENT:  Head: Normocephalic and atraumatic.  Right Ear: Hearing, tympanic membrane, external ear and ear canal normal. No drainage, swelling or tenderness. Tympanic membrane is not injected, not erythematous and not bulging. No middle ear effusion. No decreased hearing is noted.  Left Ear: Hearing, tympanic membrane, external ear and ear canal normal. No drainage, swelling or tenderness. Tympanic membrane is not injected, not erythematous and not bulging.  No middle ear effusion. No decreased hearing is noted.  Nose: Nose normal. Right sinus exhibits no maxillary sinus  tenderness and no frontal sinus tenderness. Left sinus exhibits no maxillary sinus tenderness and no frontal sinus tenderness.  Mouth/Throat: Uvula is midline, oropharynx is clear and moist and mucous membranes are normal. No oropharyngeal exudate, posterior oropharyngeal edema, posterior oropharyngeal erythema or tonsillar abscesses.  Eyes: Conjunctivae are normal.  Cardiovascular: Regular rhythm and normal heart sounds.   Pulmonary/Chest: Effort normal. No respiratory distress. He has wheezes. He has no rhonchi. He has no rales.  Few expiratory wheezes bilateral lung fields  Lymphadenopathy:       Head (right side): No submental, no submandibular, no tonsillar, no preauricular, no posterior auricular and no occipital adenopathy present.       Head (left side): No submental, no submandibular, no tonsillar, no preauricular, no posterior auricular and no occipital adenopathy present.    He has no cervical adenopathy.  Neurological: He is alert.  Skin: Skin is warm and dry.  Psychiatric: He has a normal mood and affect. His speech is normal and behavior is normal.  Vitals reviewed.      Assessment & Plan:  1. Influenza Flu. Afebrile today. Patient is well appearing and in no acute distress. Sao2 96 Concern with h/o COPD. Long discussion with patient and wife about concern for age, COPD. They understand to be hyper vigilant, if SOB, cough increases, he understands to go to ER for further evaluation. Declined nebulizer treatment today as would prefer to use albuterol inhaler when he gets home.  - POC Influenza A&B (Binax test) - DG Chest 2 View - oseltamivir (TAMIFLU) 75 MG capsule; Take 1 capsule (75 mg total) by mouth 2 (two) times daily.  Dispense: 10 capsule; Refill: 0     I am having [name] start on oseltamivir. I am also having him maintain his cetirizine, budesonide-formoterol, albuterol, glucosamine-chondroitin, docusate sodium, omeprazole, and PARoxetine.   Meds ordered this  encounter  Medications  . oseltamivir (TAMIFLU) 75 MG capsule    Sig: Take 1 capsule (75 mg total) by mouth 2 (two) times daily.    Dispense:  10 capsule    Refill:  0    Order Specific Question:   Supervising Provider    Answer:   Sherlene ShamsULLO, TERESA L [[id number]]    Return precautions given.   Risks, benefits, and alternatives of the medications and treatment plan prescribed today were discussed, and patient expressed understanding.   Education regarding symptom management and diagnosis given to patient on AVS.  Continue to follow with Rennie PlowmanMargaret Thad Osoria, FNP for routine health maintenance.   Michaela CornerGeorge R Parlow Sr. and I agreed with plan.   Rennie PlowmanMargaret Timber Marshman, FNP

## 2016-09-19 NOTE — Patient Instructions (Signed)
Stay very vigilant.   Chest xray.   Let me know if worsening  Use albuterol every 6 hours for first 24 hours to get good medication into the lungs and loosen congestion; after, you may use as needed and eventually stop all together when cough resolves.    Influenza, Adult Influenza ("the flu") is an infection in the lungs, nose, and throat (respiratory tract). It is caused by a virus. The flu causes many common cold symptoms, as well as a high fever and body aches. It can make you feel very sick. The flu spreads easily from person to person (is contagious). Getting a flu shot (influenza vaccination) every year is the best way to prevent the flu. Follow these instructions at home:  Take over-the-counter and prescription medicines only as told by your doctor.  Use a cool mist humidifier to add moisture (humidity) to the air in your home. This can make it easier to breathe.  Rest as needed.  Drink enough fluid to keep your pee (urine) clear or pale yellow.  Cover your mouth and nose when you cough or sneeze.  Wash your hands with soap and water often, especially after you cough or sneeze. If you cannot use soap and water, use hand sanitizer.  Stay home from work or school as told by your doctor. Unless you are visiting your doctor, try to avoid leaving home until your fever has been gone for 24 hours without the use of medicine.  Keep all follow-up visits as told by your doctor. This is important. How is this prevented?  Getting a yearly (annual) flu shot is the best way to avoid getting the flu. You may get the flu shot in late summer, fall, or winter. Ask your doctor when you should get your flu shot.  Wash your hands often or use hand sanitizer often.  Avoid contact with people who are sick during cold and flu season.  Eat healthy foods.  Drink plenty of fluids.  Get enough sleep.  Exercise regularly. Contact a doctor if:  You get new symptoms.  You have:  Chest  pain.  Watery poop (diarrhea).  A fever.  Your cough gets worse.  You start to have more mucus.  You feel sick to your stomach (nauseous).  You throw up (vomit). Get help right away if:  You start to be short of breath or have trouble breathing.  Your skin or nails turn a bluish color.  You have very bad pain or stiffness in your neck.  You get a sudden headache.  You get sudden pain in your face or ear.  You cannot stop throwing up. This information is not intended to replace advice given to you by your health care provider. Make sure you discuss any questions you have with your health care provider. Document Released: 04/26/2008 Document Revised: 12/24/2015 Document Reviewed: 05/12/2015 Elsevier Interactive Patient Education  2017 [employer]

## 2016-09-19 NOTE — Progress Notes (Signed)
Pre visit review using our clinic review tool, if applicable. No additional management support is needed unless otherwise documented below in the visit note. 

## 2016-10-30 DIAGNOSIS — S61002A Unspecified open wound of left thumb without damage to nail, initial encounter: Secondary | ICD-10-CM | POA: Diagnosis not present

## 2016-11-02 ENCOUNTER — Other Ambulatory Visit: Payer: Self-pay | Admitting: Family

## 2016-11-06 ENCOUNTER — Other Ambulatory Visit: Payer: Self-pay | Admitting: Family

## 2016-11-07 MED ORDER — PAROXETINE HCL 20 MG PO TABS: 20.0000 mg | ORAL_TABLET | Freq: Every day | ORAL | 0 refills | Status: DC

## 2016-11-07 NOTE — Telephone Encounter (Signed)
Medication has been refilled.

## 2016-12-20 ENCOUNTER — Ambulatory Visit (INDEPENDENT_AMBULATORY_CARE_PROVIDER_SITE_OTHER): Payer: Medicare HMO

## 2016-12-20 VITALS — BP 142/82 | HR 74 | Temp 98.8°F | Resp 14 | Ht 71.0 in | Wt 189.8 lb

## 2016-12-20 DIAGNOSIS — Z0000 Encounter for general adult medical examination without abnormal findings: Secondary | ICD-10-CM | POA: Diagnosis not present

## 2016-12-20 NOTE — Progress Notes (Signed)
Subjective:   Joseph CornerGeorge R Medal Sr. is a 76 y.o. male who presents for Medicare Annual/Subsequent preventive examination.  Review of Systems:  No ROS.  Medicare Wellness Visit.  Cardiac Risk Factors include: advanced age (>4955men, 37>65 women);male gender     Objective:    Vitals: BP (!) 142/82 (BP Location: Left Arm, Patient Position: Sitting, Cuff Size: Normal)   Pulse 74   Temp 98.8 F (37.1 C) (Oral)   Resp 14   Ht 5\' 11"  (1.803 m)   Wt 189 lb 12.8 oz (86.1 kg)   SpO2 96%   BMI 26.47 kg/m   Body mass index is 26.47 kg/m.  Tobacco History  Smoking Status  . Former Smoker  . Years: 30.00  . Types: Cigarettes  . Quit date: 04/26/2000  Smokeless Tobacco  . Never Used     Counseling given: Not Answered   Past Medical History:  Diagnosis Date  . Bladder outlet obstruction   . BPH (benign prostatic hyperplasia)   . COPD (chronic obstructive pulmonary disease) (HCC)    PULMOLOGIST -- [name].  [facility]  . Elevated PSA   . GERD (gastroesophageal reflux disease)   . History of exercise stress test    06-26-2009  w/ echo--  normal ETT and stress echo images, no ishemia, ef 65%  . [facility]    Apr 2015--  no residual  . Lower urinary tract symptoms (LUTS)   . 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 Urinary hesitancy   . Weak urine stream   . Wears glasses    Past Surgical History:  Procedure Laterality Date  . GREEN LIGHT LASER TURP (TRANSURETHRAL RESECTION OF PROSTATE N/A 07/21/2015   Procedure: GREEN LIGHT LASER TURP (TRANSURETHRAL RESECTION OF PROSTATE;  Surgeon: [name], MD;  [facility];  Service: Urology;  Laterality: N/A;  . TONSILLECTOMY  as child   Family History  Problem Relation Age of Onset  . Cancer Mother        liver  . COPD Sister    History  Sexual Activity  . Sexual activity: No    Outpatient Encounter Prescriptions as of 12/20/2016    Medication Sig  . albuterol (PROVENTIL HFA;VENTOLIN HFA) 108 (90 BASE) MCG/ACT inhaler Inhale 2 puffs into the lungs every 4 (four) hours as needed.  . budesonide-formoterol (SYMBICORT) 160-4.5 MCG/ACT inhaler Inhale 2 puffs into the lungs 2 (two) times daily.  . cetirizine (ZYRTEC) 10 MG tablet Take 10 mg by mouth at bedtime.   . docusate sodium (COLACE) 100 MG capsule Take 100 mg by mouth every evening.  Marland Kitchen. glucosamine-chondroitin 500-400 MG tablet Take 1 tablet by mouth 3 (three) times daily.  Marland Kitchen. omeprazole (PRILOSEC) 20 MG capsule Take 20 mg by mouth as needed.  Marland Kitchen. PARoxetine (PAXIL) 20 MG tablet Take 1 tablet (20 mg total) by mouth daily.  . [DISCONTINUED] oseltamivir (TAMIFLU) 75 MG capsule Take 1 capsule (75 mg total) by mouth 2 (two) times daily.   No facility-administered encounter medications on file as of 12/20/2016.     Activities of Daily Living In your present state of health, do you have any difficulty performing the following activities: 12/20/2016  Hearing? N  Vision? N  Difficulty concentrating or making decisions? N  Walking or climbing stairs? N  Dressing or bathing? N  Doing errands, shopping? N  Preparing Food and eating ? N  Using the Toilet? N  In the past six months, have you accidently leaked urine? N  Do you have problems with loss of bowel control? N  Managing your Medications? N  Managing your Finances? N  Housekeeping or managing your Housekeeping? N  Some recent data might be hidden    Patient Care Team: Allegra Grana, FNP as PCP - General (Family Medicine)   Assessment:    This is a routine wellness examination for Joseph Benson. The goal of the wellness visit is to assist the patient how to close the gaps in care and create a preventative care plan for the patient.   Osteoporosis risk reviewed.  Medications reviewed; taking without issues or barriers.  Safety issues reviewed; smoke detectors in the home. Firearms locked up in the home. Wears  seatbelts when driving or riding with others. Patient does wear sunscreen or protective clothing when in direct sunlight. No violence in the home.  Depression- PHQ 2 &9 complete.  No signs/symptoms or verbal communication regarding little pleasure in doing things, feeling down, depressed or hopeless. No changes in sleeping, energy, eating, concentrating.  No thoughts of self harm or harm towards others.  Time spent on this topic is 8 minutes.   Patient is alert, normal appearance, oriented to person/place/and time. Correctly identified the president of the [place], recall of 3/3 words, and performing simple calculations.  Patient displays appropriate judgement and can read correct time from watch face.  No new identified risk were noted.  No failures at ADL's or IADL's.   BMI- discussed the importance of a healthy diet, water intake and exercise. Educational material provided.   Daily fluid intake: 6 cups of caffeine, 1/2 cups of water Encouraged to increase water intake  HTN- followed by PCP.    Dental- every six months.  Dr. Leone Haven  Eye- Visual acuity not assessed per patient preference since they have regular follow up with the ophthalmologist.  Wears corrective lenses.  Sleep patterns- Sleeps 8 hours at night.  Wakes feeling rested.  TDAP vaccine deferred per patient preference.  Follow up with insurance.  Educational material provided.  Patient Concerns: BP readings at home have been running in the 140/80 range.  He notices SOB increased with walking. Follow up scheduled with PCP.  Exercise Activities and Dietary recommendations Current Exercise Habits: Home exercise routine, Type of exercise: treadmill;strength training/weights, Frequency (Times/Week): 2, Intensity: Moderate  Goals    . Increase water intake          Stay hydrated and drink plenty of fluids/water Fill and finish cup of water when taking medications      Fall Risk Fall Risk  12/20/2016 09/19/2016  05/18/2016 02/16/2016 07/31/2014  Falls in the past year? No No No No No   Depression Screen PHQ 2/9 Scores 12/20/2016 09/19/2016 05/18/2016 02/16/2016  PHQ - 2 Score 0 0 0 0  PHQ- 9 Score 0 - - -    Cognitive Function MMSE - Mini Mental State Exam 12/20/2016  Orientation to time 5  Orientation to Place 5  Registration 3  Attention/ Calculation 5  Recall 3  Language- name 2 objects 2  Language- repeat 1  Language- follow 3 step command 3  Language- read & follow direction 1  Write a sentence 1  Copy design 1  Total score 30        Immunization History  Administered Date(s) Administered  . Influenza Split 06/02/2011, 05/17/2014  . Influenza Whole 05/30/2013  . Influenza, High Dose Seasonal PF 05/18/2016  . Influenza-Unspecified  04/27/2015  . Pneumococcal Conjugate-13 07/31/2014  . Pneumococcal Polysaccharide-23 08/31/2012  . Zoster 07/07/2014   Screening Tests Health Maintenance  Topic Date Due  . TETANUS/TDAP  02/07/1961  . INFLUENZA VACCINE  03/01/2017  . COLONOSCOPY  10/05/2020  . PNA vac Low Risk Adult  Completed      Plan:    End of life planning; Advance aging; Advanced directives discussed. Copy of current HCPOA/Living Will requested.    I have personally reviewed and noted the following in the patient's chart:   . Medical and social history . Use of alcohol, tobacco or illicit drugs  . Current medications and supplements . Functional ability and status . Nutritional status . Physical activity . Advanced directives . List of other physicians . Hospitalizations, surgeries, and ER visits in previous 12 months . Vitals . Screenings to include cognitive, depression, and falls . Referrals and appointments  In addition, I have reviewed and discussed with patient certain preventive protocols, quality metrics, and best practice recommendations. A written personalized care plan for preventive services as well as general preventive health recommendations were  provided to patient.     Ashok Pall, LPN  0/86/5784 Agree with plan. Will have CMA call to schedule an appt to monitor BP. Rennie Plowman, NP

## 2016-12-20 NOTE — Patient Instructions (Addendum)
  [name] , Thank you for taking time to come for your Medicare Wellness Visit. I appreciate your ongoing commitment to your health goals. Please review the following plan we discussed and let me know if I can assist you in the future.   Follow up with Joseph PlowmanMargaret Arnett FNP,  as needed.    Bring a copy of your Health Care Power of Attorney and/or Living Will to be scanned into chart.  Have a great day!  These are the goals we discussed: Goals    . Increase water intake          Stay hydrated and drink plenty of fluids/water Fill and finish cup of water when taking medications       This is a list of the screening recommended for you and due dates:  Health Maintenance  Topic Date Due  . Tetanus Vaccine  02/07/1961  . Flu Shot  03/01/2017  . Colon Cancer Screening  10/05/2020  . Pneumonia vaccines  Completed

## 2016-12-21 ENCOUNTER — Telehealth: Payer: Self-pay | Admitting: Family

## 2016-12-21 NOTE — Telephone Encounter (Signed)
Call pt  call to schedule an appt to monitor BP. Had been ever so slightly elevated when saw denisa

## 2016-12-21 NOTE — Telephone Encounter (Signed)
Spoke with the patients wife, he has a visit with you next week so he will see you then.  His wife is a Charity fundraiserN so she will keep track of his BP for the next few days till the appt and have those for you to review at the appt. thanks

## 2016-12-27 ENCOUNTER — Encounter: Payer: Self-pay | Admitting: Family

## 2016-12-27 ENCOUNTER — Ambulatory Visit (INDEPENDENT_AMBULATORY_CARE_PROVIDER_SITE_OTHER): Payer: Medicare HMO | Admitting: Family

## 2016-12-27 DIAGNOSIS — R030 Elevated blood-pressure reading, without diagnosis of hypertension: Secondary | ICD-10-CM | POA: Diagnosis not present

## 2016-12-27 NOTE — Progress Notes (Signed)
Subjective:    Patient ID: Joseph KEHOE Sr., male    DOB: [birth year], 76 y.o.   MRN: [id number]  CC: Joseph ROCKETT Sr. is a 76 y.o. male who presents today for follow up.   HPI: Elevated blood pressure, had been elevated during annual wellness visit.    Going to gym now 2x per week.   Limits salt.   Has been monitoring and average less than 135/75.    SOB at 'baseline' from COPD.  Denies exertional chest pain or pressure, numbness or tingling radiating to left arm or jaw, palpitations, dizziness, frequent headaches, changes in vision.          HISTORY:  Past Medical History:  Diagnosis Date  . Bladder outlet obstruction   . BPH (benign prostatic hyperplasia)   . COPD (chronic obstructive pulmonary disease) (HCC)    PULMOLOGIST -- Dr. Meredeth Ide.  [facility])  . Elevated PSA   . GERD (gastroesophageal reflux disease)   . History of exercise stress test    06-26-2009  w/ echo--  normal ETT and stress echo images, no ishemia, ef 65%  . [facility]    Apr 2015--  no residual  . Lower urinary tract symptoms (LUTS)   . 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 Urinary hesitancy   . Weak urine stream   . Wears glasses    Past Surgical History:  Procedure Laterality Date  . GREEN LIGHT LASER TURP (TRANSURETHRAL RESECTION OF PROSTATE N/A 07/21/2015   Procedure: GREEN LIGHT LASER TURP (TRANSURETHRAL RESECTION OF PROSTATE;  Surgeon: Jerilee Field, MD;  Location[facility];  Service: Urology;  Laterality: N/A;  . TONSILLECTOMY  as child   Family History  Problem Relation Age of Onset  . Cancer Mother        liver  . COPD Sister     Allergies: Spiriva [tiotropium bromide monohydrate] Current Outpatient Prescriptions on File Prior to Visit  Medication Sig Dispense Refill  . albuterol (PROVENTIL HFA;VENTOLIN HFA) 108 (90 BASE) MCG/ACT inhaler Inhale 2 puffs into the lungs every 4 (four)  hours as needed. 18 g 3  . budesonide-formoterol (SYMBICORT) 160-4.5 MCG/ACT inhaler Inhale 2 puffs into the lungs 2 (two) times daily. 1 Inhaler 3  . cetirizine (ZYRTEC) 10 MG tablet Take 10 mg by mouth at bedtime.     . docusate sodium (COLACE) 100 MG capsule Take 100 mg by mouth every evening.    Marland Kitchen glucosamine-chondroitin 500-400 MG tablet Take 1 tablet by mouth 3 (three) times daily.    Marland Kitchen omeprazole (PRILOSEC) 20 MG capsule Take 20 mg by mouth as needed.    Marland Kitchen PARoxetine (PAXIL) 20 MG tablet Take 1 tablet (20 mg total) by mouth daily. 90 tablet 0   No current facility-administered medications on file prior to visit.     Social History  Substance Use Topics  . Smoking status: Former Smoker    Years: 30.00    Types: Cigarettes    Quit date: 04/26/2000  . Smokeless tobacco: Never Used  . Alcohol use 0.6 oz/week    1 Cans of beer per week     Comment: occasional    Review of Systems  Constitutional: Negative for chills and fever.  Eyes: Negative for visual disturbance.  Respiratory: Negative for cough.   Cardiovascular: Negative for chest pain and palpitations.  Gastrointestinal: Negative for nausea and vomiting.  Neurological: Negative for headaches.  Objective:    BP 122/68   Pulse 80   Temp 97.7 F (36.5 C) (Oral)   Ht 5\' 11"  (1.803 m)   Wt 190 lb 12.8 oz (86.5 kg)   SpO2 97%   BMI 26.61 kg/m  BP Readings from Last 3 Encounters:  12/27/16 122/68  12/20/16 (!) 142/82  09/19/16 (!) 146/76   Wt Readings from Last 3 Encounters:  12/27/16 190 lb 12.8 oz (86.5 kg)  12/20/16 189 lb 12.8 oz (86.1 kg)  09/19/16 190 lb 9.6 oz (86.5 kg)    Physical Exam  Constitutional: He appears well-developed and well-nourished.  Cardiovascular: Regular rhythm and normal heart sounds.   Pulmonary/Chest: Effort normal and breath sounds normal. No respiratory distress. He has no wheezes. He has no rhonchi. He has no rales.  Neurological: He is alert.  Skin: Skin is warm and dry.    Psychiatric: He has a normal mood and affect. His speech is normal and behavior is normal.  Vitals reviewed.      Assessment & Plan:   Problem List Items Addressed This Visit      Other   Elevated blood pressure reading    Well below goal today. Will continue to monitor. Discussed exercise, limiting salt.           I am having [name] maintain his cetirizine, budesonide-formoterol, albuterol, glucosamine-chondroitin, docusate sodium, omeprazole, and PARoxetine.   No orders of the defined types were placed in this encounter.   Return precautions given.   Risks, benefits, and alternatives of the medications and treatment plan prescribed today were discussed, and patient expressed understanding.   Education regarding symptom management and diagnosis given to patient on AVS.  Continue to follow with Allegra GranaArnett, Ki Luckman G, FNP for routine health maintenance.   Joseph CornerGeorge R Kundrat Sr. and I agreed with plan.   Rennie PlowmanMargaret Balen Woolum, FNP

## 2016-12-27 NOTE — Patient Instructions (Signed)
Pleasure seeing  Blood pressure looks great. Goal is less 135/85.

## 2016-12-27 NOTE — Progress Notes (Signed)
Pre visit review using our clinic review tool, if applicable. No additional management support is needed unless otherwise documented below in the visit note. 

## 2016-12-27 NOTE — Assessment & Plan Note (Signed)
Well below goal today. Will continue to monitor. Discussed exercise, limiting salt.

## 2017-01-23 ENCOUNTER — Other Ambulatory Visit: Payer: Self-pay | Admitting: Family

## 2017-01-27 ENCOUNTER — Encounter: Payer: Self-pay | Admitting: Family

## 2017-02-06 ENCOUNTER — Other Ambulatory Visit: Payer: Self-pay | Admitting: Family

## 2017-02-07 NOTE — Telephone Encounter (Signed)
Pharmacy called and is requesting a refill. Pt has medication they are just looking for a refill to leave on file. Please advise, thank you!

## 2017-03-07 DIAGNOSIS — J449 Chronic obstructive pulmonary disease, unspecified: Secondary | ICD-10-CM | POA: Diagnosis not present

## 2017-05-02 DIAGNOSIS — R69 Illness, unspecified: Secondary | ICD-10-CM | POA: Diagnosis not present

## 2017-08-09 ENCOUNTER — Ambulatory Visit (INDEPENDENT_AMBULATORY_CARE_PROVIDER_SITE_OTHER)
Admission: RE | Admit: 2017-08-09 | Discharge: 2017-08-09 | Disposition: A | Discharge status: Home / Self Care | Payer: Medicare HMO | Source: Ambulatory Visit | Attending: Family Medicine | Admitting: Family Medicine

## 2017-08-09 ENCOUNTER — Ambulatory Visit: Payer: Self-pay

## 2017-08-09 ENCOUNTER — Ambulatory Visit (INDEPENDENT_AMBULATORY_CARE_PROVIDER_SITE_OTHER): Payer: Medicare HMO | Admitting: Family Medicine

## 2017-08-09 ENCOUNTER — Encounter: Payer: Self-pay | Admitting: Family Medicine

## 2017-08-09 VITALS — BP 120/64 | HR 87 | Temp 98.4°F | Wt 184.0 lb

## 2017-08-09 DIAGNOSIS — J441 Chronic obstructive pulmonary disease with (acute) exacerbation: Secondary | ICD-10-CM | POA: Diagnosis not present

## 2017-08-09 DIAGNOSIS — J449 Chronic obstructive pulmonary disease, unspecified: Secondary | ICD-10-CM

## 2017-08-09 MED ORDER — PREDNISONE 20 MG PO TABS: ORAL_TABLET | ORAL | 0 refills | Status: DC

## 2017-08-09 MED ORDER — DOXYCYCLINE HYCLATE 100 MG PO TABS: 100.0000 mg | ORAL_TABLET | Freq: Two times a day (BID) | ORAL | 0 refills | Status: DC

## 2017-08-09 NOTE — Telephone Encounter (Signed)
Pt has 30' appt on 08/09/17 at 3 PM with Dr Reece AgarG.

## 2017-08-09 NOTE — Assessment & Plan Note (Addendum)
Treat with doxy 10d course, prednisone taper, scheduled albuterol x 3 days then PRN. Further supportive care as per instructions. Update if not improving with treatment.  Given RLL rales heard - check CXR - clear on my read. Will await pulmonology over read.

## 2017-08-09 NOTE — Patient Instructions (Signed)
You have COPD flare. Treat with doxycycline and prednisone course. Schedule albuterol over next 3 days then return to as needed. Push fluids and rest. May take plain mucinex to break up mucous. Let us know if ongoing fever >101, or persistent worsening productive cough.   Chronic Obstructive Pulmonary Disease Exacerbation Chronic obstructive pulmonary disease (COPD) is a common lung problem. In COPD, the flow of air from the lungs is limited. COPD exacerbations are times that breathing gets worse and you need extra treatment. Without treatment they can be life threatening. If they happen often, your lungs can become more damaged. If your COPD gets worse, your doctor may treat you with:  Medicines.  Oxygen.  Different ways to clear your airway, such as using a mask.  Follow these instructions at home:  Do not smoke.  Avoid tobacco smoke and other things that bother your lungs.  If given, take your antibiotic medicine as told. Finish the medicine even if you start to feel better.  Only take medicines as told by your doctor.  Drink enough fluids to keep your pee (urine) clear or pale yellow (unless your doctor has told you not to).  Use a cool mist machine (vaporizer).  If you use oxygen or a machine that turns liquid medicine into a mist (nebulizer), continue to use them as told.  Keep up with shots (vaccinations) as told by your doctor.  Exercise regularly.  Eat healthy foods.  Keep all doctor visits as told. Get help right away if:  You are very short of breath and it gets worse.  You have trouble talking.  You have bad chest pain.  You have blood in your spit (sputum).  You have a fever.  You keep throwing up (vomiting).  You feel weak, or you pass out (faint).  You feel confused.  You keep getting worse. This information is not intended to replace advice given to you by your health care provider. Make sure you discuss any questions you have with your health  care provider. Document Released: 07/07/2011 Document Revised: 12/24/2015 Document Reviewed: 03/22/2013 Elsevier Interactive Patient Education  2017 [employer]

## 2017-08-09 NOTE — Telephone Encounter (Signed)
Patient's wife called in with "my husband is more short of breath than normal and he's been running a fever the past 2 night. I'm afraid he may have pneumonia on top of his COPD." He gave verbal consent to talk to her. She reports he has been dealing with a cold the past 2-3 weeks and the past week his breathing has gradually gotten worse to the point that it takes him 5 minutes or so to recover while sitting in a recliner walking to the bathroom and back. She says for the past 2 nights, he has woke her up around 0230-0300 with shivering, chills. She checked his temperature and it was 101 orally both nights, tylenol was given, fever came down after about 1 hours. I asked was he febrile during the day yesterday and she says he slept about all day, so she didn't check it. She reports he is afebrile at this time, 97.6. He doesn't wear oxygen and she says his color has been good. According to protocol, go to ED, but she prefers him to be seen in the office. There were no available appointments at his provider's office, [facility], appointment made for today, care advice given, she verbalized understanding.    Reason for Disposition . [1] Fever > 101 F (38.3 C) AND [2] age > 1660  Answer Assessment - Initial Assessment Questions 1. RESPIRATORY STATUS: "Describe your breathing?" (e.g., wheezing, shortness of breath, unable to speak, severe coughing)      Shortness of breath on exertion, coughing, wheezing at times 2. ONSET: "When did this breathing problem begin?"      Progressing for about 1 week 3. PATTERN "Does the difficult breathing come and go, or has it been constant since it started?"      Constant, but eases off with rest, more chest congestion x 1 week 4. SEVERITY: "How bad is your breathing?" (e.g., mild, moderate, severe)    - MILD: No SOB at rest, mild SOB with walking, speaks normally in sentences, can lay down, no retractions, pulse < 100.    - MODERATE: SOB at rest,  SOB with minimal exertion and prefers to sit, cannot lie down flat, speaks in phrases, mild retractions, audible wheezing, pulse 100-120.    - SEVERE: Very SOB at rest, speaks in single words, struggling to breathe, sitting hunched forward, retractions, pulse > 120      Moderate 5. RECURRENT SYMPTOM: "Have you had difficulty breathing before?" If so, ask: "When was the last time?" and "What happened that time?"      Yes, congestion from cold 6. CARDIAC HISTORY: "Do you have any history of heart disease?" (e.g., heart attack, angina, bypass surgery, angioplasty)      No 7. LUNG HISTORY: "Do you have any history of lung disease?"  (e.g., pulmonary embolus, asthma, emphysema)     COPD, emphysema 8. CAUSE: "What do you think is causing the breathing problem?"      I don't know what's causing fevers 9. OTHER SYMPTOMS: "Do you have any other symptoms? (e.g., dizziness, runny nose, cough, chest pain, fever)    Fever past 2 nights 10. PREGNANCY: "Is there any chance you are pregnant?" "When was your last menstrual period?"      N/A 11. TRAVEL: "Have you traveled out of the country in the last month?" (e.g., travel history, exposures)       No  Protocols used: BREATHING DIFFICULTY-A-AH

## 2017-08-09 NOTE — Progress Notes (Signed)
BP 120/64 (BP Location: Left Arm, Patient Position: Sitting, Cuff Size: Normal)   Pulse 87   Temp 98.4 F (36.9 C) (Oral)   Wt 184 lb (83.5 kg)   SpO2 94%   BMI 25.66 kg/m    CC: fever Subjective:    Patient ID: Joseph CornerGeorge R Penick Sr., male    DOB: 1941-07-06, 77 y.o.   MRN: [id number]  HPI: Joseph SheehanGeorge R Dimartino Sr. is a 77 y.o. male presenting on 08/09/2017 for Fever (Has had 101 fever last 2 nights.  Also, has chest congestion. Has COPD and is concerned about pneumonia. Has taken Tylenol and had to use inhaler at night to help sleep.  FYI, has appt with pulmonologist end of Feb.)   Here with wife who is retired Charity fundraiserN.  Pleasant 76yo patient of Claris CheMargaret Arnett's, new to me, presents to office today with 2 night h/o fever to 101, chills, wet sounding cough, increased wheezing. Last 2 nights has needed his albuterol - this is new. 3 wks ago did have URI - that resolved on its own. Some chest pressure as well. Chest > head congestion. Weight loss noted.   Increased cough, increased sputum production, increased dyspnea from norm.   No ear pain, tooth pain, leg swelling, ST, PNdrainage, abd pain, nausea, headache.   Treating with tylenol, albuterol and pseudophed. As well as his chronic symbicort.  Ex smoker quit 2001 Known h/o COPD Meredeth Ide([place]) on symbicort and albuterol PRN, no known h/o CAD.   Relevant past medical, surgical, family and social history reviewed and updated as indicated. Interim medical history since our last visit reviewed. Allergies and medications reviewed and updated. Outpatient Medications Prior to Visit  Medication Sig Dispense Refill  . albuterol (PROVENTIL HFA;VENTOLIN HFA) 108 (90 BASE) MCG/ACT inhaler Inhale 2 puffs into the lungs every 4 (four) hours as needed. 18 g 3  . budesonide-formoterol (SYMBICORT) 160-4.5 MCG/ACT inhaler Inhale 2 puffs into the lungs 2 (two) times daily. 1 Inhaler 3  . cetirizine (ZYRTEC) 10 MG tablet Take 10 mg by mouth at bedtime.     . docusate  sodium (COLACE) 100 MG capsule Take 100 mg by mouth every evening.    Marland Kitchen. glucosamine-chondroitin 500-400 MG tablet Take 1 tablet by mouth 3 (three) times daily.    Marland Kitchen. omeprazole (PRILOSEC) 20 MG capsule Take 20 mg by mouth as needed.    Marland Kitchen. PARoxetine (PAXIL) 20 MG tablet TAKE 1 TABLET BY MOUTH ONCE DAILY 90 tablet 1   No facility-administered medications prior to visit.      Per HPI unless specifically indicated in ROS section below Review of Systems     Objective:    BP 120/64 (BP Location: Left Arm, Patient Position: Sitting, Cuff Size: Normal)   Pulse 87   Temp 98.4 F (36.9 C) (Oral)   Wt 184 lb (83.5 kg)   SpO2 94%   BMI 25.66 kg/m   Wt Readings from Last 3 Encounters:  08/09/17 184 lb (83.5 kg)  12/27/16 190 lb 12.8 oz (86.5 kg)  12/20/16 189 lb 12.8 oz (86.1 kg)    Physical Exam  Constitutional: He appears well-developed and well-nourished. No distress.  HENT:  Head: Normocephalic and atraumatic.  Right Ear: Hearing, tympanic membrane, external ear and ear canal normal.  Left Ear: Hearing, tympanic membrane, external ear and ear canal normal.  Nose: Nose normal. No mucosal edema or rhinorrhea. Right sinus exhibits no maxillary sinus tenderness and no frontal sinus tenderness. Left sinus exhibits no maxillary sinus tenderness  and no frontal sinus tenderness.  Mouth/Throat: Uvula is midline, oropharynx is clear and moist and mucous membranes are normal. No oropharyngeal exudate, posterior oropharyngeal edema, posterior oropharyngeal erythema or tonsillar abscesses.  Eyes: Conjunctivae and EOM are normal. Pupils are equal, round, and reactive to light. No scleral icterus.  Neck: Normal range of motion. Neck supple.  Cardiovascular: Normal rate, regular rhythm, normal heart sounds and intact distal pulses.  No murmur heard. Pulmonary/Chest: Effort normal. No respiratory distress. He has wheezes (faint exp throughout). He has rales (RLL).  Lymphadenopathy:    He has no  cervical adenopathy.  Skin: Skin is warm and dry. No rash noted.  Nursing note and vitals reviewed.      Assessment & Plan:   Problem List Items Addressed This Visit    COPD (chronic obstructive pulmonary disease) (HCC)   Relevant Medications   predniSONE (DELTASONE) 20 MG tablet   COPD exacerbation (HCC) - Primary    Treat with doxy 10d course, prednisone taper, scheduled albuterol x 3 days then PRN. Further supportive care as per instructions. Update if not improving with treatment.  Given RLL rales heard - check CXR - clear on my read. Will await pulmonology over read.       Relevant Medications   predniSONE (DELTASONE) 20 MG tablet   Other Relevant Orders   DG Chest 2 View       Follow up plan: Return if symptoms worsen or fail to improve.  Eustaquio Boyden, MD

## 2017-08-10 ENCOUNTER — Telehealth: Payer: Self-pay | Admitting: Family Medicine

## 2017-08-10 ENCOUNTER — Other Ambulatory Visit: Payer: Self-pay | Admitting: Family Medicine

## 2017-08-10 DIAGNOSIS — J189 Pneumonia, unspecified organism: Secondary | ICD-10-CM

## 2017-08-10 DIAGNOSIS — J181 Lobar pneumonia, unspecified organism: Principal | ICD-10-CM

## 2017-08-10 NOTE — Telephone Encounter (Signed)
Released results via mychart. I have asked him to come in in 4 wks for rpt CXR. Ordered.

## 2017-09-12 DIAGNOSIS — H04123 Dry eye syndrome of bilateral lacrimal glands: Secondary | ICD-10-CM | POA: Diagnosis not present

## 2017-09-12 DIAGNOSIS — H25813 Combined forms of age-related cataract, bilateral: Secondary | ICD-10-CM | POA: Diagnosis not present

## 2017-09-12 DIAGNOSIS — H43393 Other vitreous opacities, bilateral: Secondary | ICD-10-CM | POA: Diagnosis not present

## 2017-09-12 DIAGNOSIS — H524 Presbyopia: Secondary | ICD-10-CM | POA: Diagnosis not present

## 2017-09-12 DIAGNOSIS — H04203 Unspecified epiphora, bilateral lacrimal glands: Secondary | ICD-10-CM | POA: Diagnosis not present

## 2017-09-19 DIAGNOSIS — H25813 Combined forms of age-related cataract, bilateral: Secondary | ICD-10-CM | POA: Diagnosis not present

## 2017-09-19 DIAGNOSIS — H524 Presbyopia: Secondary | ICD-10-CM | POA: Diagnosis not present

## 2017-09-19 DIAGNOSIS — H04123 Dry eye syndrome of bilateral lacrimal glands: Secondary | ICD-10-CM | POA: Diagnosis not present

## 2017-09-19 DIAGNOSIS — H43393 Other vitreous opacities, bilateral: Secondary | ICD-10-CM | POA: Diagnosis not present

## 2017-09-22 DIAGNOSIS — R918 Other nonspecific abnormal finding of lung field: Secondary | ICD-10-CM | POA: Diagnosis not present

## 2017-09-22 DIAGNOSIS — J439 Emphysema, unspecified: Secondary | ICD-10-CM | POA: Diagnosis not present

## 2017-09-22 DIAGNOSIS — J449 Chronic obstructive pulmonary disease, unspecified: Secondary | ICD-10-CM | POA: Diagnosis not present

## 2017-10-02 ENCOUNTER — Telehealth: Payer: Self-pay | Admitting: *Deleted

## 2017-10-02 DIAGNOSIS — Z0000 Encounter for general adult medical examination without abnormal findings: Secondary | ICD-10-CM

## 2017-10-02 NOTE — Telephone Encounter (Signed)
Patient scheduled a My chart CPE but appointment notes say as follows recently experiencing dizziness, especially when squatting, bending over or getting up from a seated or lying position. Want to see if it is/ isn't heart related.  Patient wife is a nurse she says she made the appointment, that this slight dizziness only occurs when bending or standing, she wold like to keep the appointment scheduled for 10/20/17 and have fasting lab prior to appointment on 19 I set up the lab appointment .

## 2017-10-03 ENCOUNTER — Telehealth: Payer: Self-pay | Admitting: Family

## 2017-10-03 NOTE — Telephone Encounter (Signed)
Mail pt  I reviewing your chart, it appears that you are due for CT of the chest for a lung cancer screen. It is annual test.  From an insurance perspective, as long as you meet the following criteria, it is paid for since it is preventative care.    Criteria for low dose lung cancer screening scan:  Age between 55-77   Current smoker or former if quit within the last 15 years  History of smoking at least 1 pack a day for 30 years or that equivalent. (2 packs a day for 15 years,  pack a day for 60 years)  I had ordered in the past for you.   If it is a screen that you a still interested in, please call the office or send me a mychart message to let me know so that I can order for you.   Hope you are well.  Best,  Eupha Lobb, NP  

## 2017-10-03 NOTE — Telephone Encounter (Signed)
Labs ordered Please let pt  Please also ensure that dizziness has resolved. If not, he may need earlier appt or even urgent care appt

## 2017-10-06 NOTE — Telephone Encounter (Signed)
Called and followed up on  dizziness and wife feels more  orthostatic related and especially when bending and squatting.   She has educated patient on how to get up and down from seated to standing position .  She doesn't feel that this is heart related.  Labs are scheduled prior to appointment.   She will call and schedule appointment sooner if needed and seek care if needed.

## 2017-10-09 NOTE — Telephone Encounter (Signed)
noted 

## 2017-10-12 ENCOUNTER — Encounter: Payer: Self-pay | Admitting: *Deleted

## 2017-10-12 NOTE — Telephone Encounter (Signed)
Mailed letter °

## 2017-10-17 ENCOUNTER — Other Ambulatory Visit: Payer: Medicare HMO

## 2017-10-17 ENCOUNTER — Telehealth: Payer: Self-pay

## 2017-10-17 NOTE — Telephone Encounter (Signed)
FYI

## 2017-10-17 NOTE — Telephone Encounter (Signed)
Copied from CRM [id number]. Topic: Quick Communication - Appointment Cancellation >> Oct 17, 2017  9:29 AM Joseph Benson, [place], [place] wrote: Patient called to cancel appointment scheduled for 10-17-2017 @9 :45 patient has  rescheduled their appointment.  Route to department's PEC pool.

## 2017-10-18 ENCOUNTER — Other Ambulatory Visit (INDEPENDENT_AMBULATORY_CARE_PROVIDER_SITE_OTHER): Payer: Medicare HMO

## 2017-10-18 DIAGNOSIS — Z125 Encounter for screening for malignant neoplasm of prostate: Secondary | ICD-10-CM

## 2017-10-18 DIAGNOSIS — Z0000 Encounter for general adult medical examination without abnormal findings: Secondary | ICD-10-CM

## 2017-10-18 LAB — COMPREHENSIVE METABOLIC PANEL
ALBUMIN: 4.5 g/dL (ref 3.5–5.2)
ALT: 16 U/L (ref 0–53)
AST: 22 U/L (ref 0–37)
Alkaline Phosphatase: 54 U/L (ref 39–117)
BUN: 19 mg/dL (ref 6–23)
CHLORIDE: 110 meq/L (ref 96–112)
CO2: 23 mEq/L (ref 19–32)
CREATININE: 0.88 mg/dL (ref 0.40–1.50)
Calcium: 9.7 mg/dL (ref 8.4–10.5)
GFR: 89.56 mL/min (ref >60.00)
Glucose, Bld: 110 mg/dL — ABNORMAL HIGH (ref 70–99)
POTASSIUM: 4.7 meq/L (ref 3.5–5.1)
Sodium: 145 mEq/L (ref 135–145)
Total Bilirubin: 0.6 mg/dL (ref 0.2–1.2)
Total Protein: 7.1 g/dL (ref 6.0–8.3)

## 2017-10-18 LAB — CBC WITH DIFFERENTIAL/PLATELET
BASOS PCT: 0.6 % (ref 0.0–3.0)
Basophils Absolute: 0 10*3/uL (ref 0.0–0.1)
EOS PCT: 2.2 % (ref 0.0–5.0)
Eosinophils Absolute: 0.2 10*3/uL (ref 0.0–0.7)
HEMATOCRIT: 42.1 % (ref 39.0–52.0)
HEMOGLOBIN: 14.1 g/dL (ref 13.0–17.0)
LYMPHS PCT: 26.7 % (ref 12.0–46.0)
Lymphs Abs: 2 10*3/uL (ref 0.7–4.0)
MCHC: 33.5 g/dL (ref 30.0–36.0)
MCV: 93.2 fl (ref 78.0–100.0)
Monocytes Absolute: 0.6 10*3/uL (ref 0.1–1.0)
Monocytes Relative: 8.6 % (ref 3.0–12.0)
NEUTROS ABS: 4.5 10*3/uL (ref 1.4–7.7)
Neutrophils Relative %: 61.9 % (ref 43.0–77.0)
PLATELETS: 225 10*3/uL (ref 150.0–400.0)
RBC: 4.52 Mil/uL (ref 4.22–5.81)
RDW: 13.9 % (ref 11.5–15.5)
WBC: 7.3 10*3/uL (ref 4.0–10.5)

## 2017-10-18 LAB — VITAMIN D 25 HYDROXY (VIT D DEFICIENCY, FRACTURES): VITD: 18.31 ng/mL — ABNORMAL LOW (ref 30.00–100.00)

## 2017-10-18 LAB — TSH: TSH: 0.45 u[IU]/mL (ref 0.35–4.50)

## 2017-10-18 LAB — LIPID PANEL
CHOLESTEROL: 191 mg/dL (ref 0–200)
HDL: 50.2 mg/dL (ref >39.00)
LDL Cholesterol: 123 mg/dL — ABNORMAL HIGH (ref 0–99)
NonHDL: 140.92
Total CHOL/HDL Ratio: 4
Triglycerides: 92 mg/dL (ref 0.0–149.0)
VLDL: 18.4 mg/dL (ref 0.0–40.0)

## 2017-10-18 LAB — PSA: PSA: 0.96 ng/mL (ref 0.10–4.00)

## 2017-10-18 LAB — HEMOGLOBIN A1C: HEMOGLOBIN A1C: 5.4 % (ref 4.6–6.5)

## 2017-10-19 ENCOUNTER — Other Ambulatory Visit: Payer: Self-pay | Admitting: Family

## 2017-10-20 ENCOUNTER — Encounter: Payer: Self-pay | Admitting: Family

## 2017-10-20 ENCOUNTER — Ambulatory Visit (INDEPENDENT_AMBULATORY_CARE_PROVIDER_SITE_OTHER): Payer: Medicare HMO | Admitting: Family

## 2017-10-20 VITALS — BP 134/74 | HR 83 | Temp 98.6°F | Wt 188.1 lb

## 2017-10-20 DIAGNOSIS — I700 Atherosclerosis of aorta: Secondary | ICD-10-CM | POA: Insufficient documentation

## 2017-10-20 DIAGNOSIS — R251 Tremor, unspecified: Secondary | ICD-10-CM | POA: Diagnosis not present

## 2017-10-20 DIAGNOSIS — M1990 Unspecified osteoarthritis, unspecified site: Secondary | ICD-10-CM | POA: Insufficient documentation

## 2017-10-20 DIAGNOSIS — H81399 Other peripheral vertigo, unspecified ear: Secondary | ICD-10-CM | POA: Diagnosis not present

## 2017-10-20 DIAGNOSIS — J449 Chronic obstructive pulmonary disease, unspecified: Secondary | ICD-10-CM | POA: Diagnosis not present

## 2017-10-20 DIAGNOSIS — Z0001 Encounter for general adult medical examination with abnormal findings: Secondary | ICD-10-CM | POA: Diagnosis not present

## 2017-10-20 DIAGNOSIS — R42 Dizziness and giddiness: Secondary | ICD-10-CM

## 2017-10-20 DIAGNOSIS — Z0000 Encounter for general adult medical examination without abnormal findings: Secondary | ICD-10-CM

## 2017-10-20 MED ORDER — DICLOFENAC SODIUM 1 % TD GEL: 4.0000 g | Freq: Four times a day (QID) | TRANSDERMAL | 3 refills | Status: DC

## 2017-10-20 MED ORDER — PRAVASTATIN SODIUM 20 MG PO TABS: 20.0000 mg | ORAL_TABLET | Freq: Every day | ORAL | 3 refills | Status: DC

## 2017-10-20 NOTE — Assessment & Plan Note (Addendum)
Symptoms most consistent with vertigo.  I chose to not perform Dix-Hallpike maneuver on patient today as when performing orthostatics -going from sitting sitting up, vertigo started.   We are pending MRI of the brain as the vertigo has become more frequent over the past 6 months.  Also a pending referral to ear nose and throat.  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  Patient will maintain close vigilance until he can be evaluated by ear nose and throat.

## 2017-10-20 NOTE — Patient Instructions (Addendum)
voltaren gel  Tdap due  MRI Brain  Referral to ENT. Let me know if you do not hear from us regarding an appointment.   Start cholesterol medication  At this point, I suspect Benign paroxysmal positional vertigo (BPPV) and have included information from [facility] below.   You may look videos for Epley's maneuvers as we discussed online.   If dizziness/vertigo persists, a referral to ENT for further evaluation and medication may be appropriate.   If there is no improvement in your symptoms, or if there is any worsening of symptoms, or if you have any additional concerns, please return to this clinic for re-evaluation; or, if we are closed, consider going to the Emergency Room for evaluation.   What is BPPV?  BPPV  is one of the most common causes of vertigo - the sudden sensation that you're spinning or that the inside of your head is spinning. Benign paroxysmal positional vertigo causes brief episodes of mild to intense dizziness. Benign paroxysmal positional vertigo is usually triggered by specific changes in the position of your head. This might occur when you tip your head up or down, when you lie down, or when you turn over or sit up in bed. Although benign paroxysmal positional vertigo can be a bothersome problem, it's rarely serious except when it increases the chance of falls.  If you experience dizziness associated with benign paroxysmal positional vertigo (BPPV), consider these tips: Be aware of the possibility of losing your balance, which can lead to falling and serious injury.  Sit down immediately when you feel dizzy.  Use good lighting if you get up at night.  Walk with a cane for stability if you're at risk of falling.  Work closely with your doctor to manage your symptoms effectively. BPPV may recur even after successful therapy. Fortunately, although there's no cure, the condition can be managed with physical therapy and home treatments.   Dizziness [Uncertain  Cause] Dizziness is a common symptom sometimes described as "lightheadedness" or feeling like you are going to faint. If it lasts for only a few seconds and is related to changes in position (such as getting up after lying or sitting for a long time), it is usually not a sign of anything serious. Dizziness that lasts for minutes to hours, or comes on for no apparent reason, may be a sign of a more serious problem (such as dehydration, a medicine reaction, disease of the heart or brain). Today's exam did not show an exact cause for your dizzy spell . Sometimes additional tests are required before a cause can be found. Therefore, it is important to follow up with your doctor if your symptoms continue. Home Care: 1) If a dizzy spell occurs and lasts more than a few seconds, lie down until it passes. If you are lying down, then you cannot hurt yourself by falling if you do faint. 2) Do not drive or operate dangerous equipment until the dizzy spells have stopped for at least 48 hours. 3) If dizzy spells occur with sudden standing, this may be a sign of mild dehydration. Drink extra fluids over the next few days. 4) If you recently started a new medicine or if you had the dose of a current medicine increased (especially blood pressure medicine), talk with the prescribing doctor about your symptoms. Dose adjustments may be needed. Follow Up with your doctor for further evaluation within the next seven days, if your symptoms continue. Get Prompt Medical Attention if any of the  following occur: -- Worsening of your symptoms -- Fainting, headache or seizure -- Repeated vomiting -- Feeling like you or the room is spinning -- Chest, arm, neck, back or jaw pain -- Palpitations (the sense that your heart is fluttering or beating fast or hard) -- Shortness of breath -- Blood in vomit or stool (black or red color) -- Weakness of an arm or leg or one side of the face -- Difficulty with speech or vision   2000-2015 [employer] [address], [place], [place] [address]. All rights reserved. This information is not intended as a substitute for professional medical care. Always follow your healthcare professional's instructions.

## 2017-10-20 NOTE — Assessment & Plan Note (Addendum)
Tremor most apparent with hands outstretched, and goal oriented behaviors such as finger to nose.  Differential includes benign essential tremor.  Patient's TSH has been normal.  Patient, wife and I jointly discussed today that the absence of worsening tremor, or new features to suggest PD, we would not consult neurology at this time.  I advised them both if continues or new symptoms develop, at that time should consult neurology.

## 2017-10-20 NOTE — Assessment & Plan Note (Signed)
Patient politely declines a prostate exam today in the absence of urinary complaints.  He also does not meet criteria for CT chest screen as he quit smoking more than 15 years ago.  I reviewed labs in depth with patient today.

## 2017-10-20 NOTE — Assessment & Plan Note (Signed)
Bilateral hand symptoms most consistent with osteoarthritis.  Trial of Voltaren gel , patient will let me know if any improvement

## 2017-10-20 NOTE — Progress Notes (Signed)
Subjective:    Patient ID: Joseph CREAR Sr., male    DOB: March 19, 1941, 77 y.o.   MRN: [id number]  CC: Joseph KUSHNIR Sr. is a 77 y.o. male who presents today for physical exam.    HPI:   Accompanied by wife.   Room starts to spin when standing. Only positional, when getting OOB, x 6 months, has become consistent.  No congestion, hearing loss, tinnitus, ear pain, sudden vision changes.   No syncope, falls, weakness. No chest pain with excerise. Regularly rides on exercise bike.   Stress test normal in 30's with pericarditis.   COPD- Dr Meredeth Ide. No increase of SOB, sputum . States at baseline today.   Recently diagnosed with bilateral cataracts.  Trouble with depth perception, night driving. Pending surgery.   Notes tremor bilateral hands with intention, unchanged, since last year. Unchanged. No gait distubances. No family h/o tremor or PD.   Also noted fingers 'lock' up and joints enlarged. No redness, fever, breaks in skin.        Colorectal  Cancer Screening: normal 7 years ago.  Prostate Cancer Screening: normal psa; h/o laser surgery to reduce size. No more hesitancy.  Lung Cancer Screening: No 30 year pack year history and > 55 years. Quit 2001 Immunizations       Tetanus - due        Pneumococcal - Candidate for, complete  Labs: Screening labs done prior.  Exercise: Gets regular exercise.  Alcohol use: one beer a day Smoking/tobacco use: former smoker.  Regular dental exams: utd Wears seat belt: Yes. Skin: No h/o skin cancer. No new lesions.   HISTORY:  Past Medical History:  Diagnosis Date  . Bladder outlet obstruction   . BPH (benign prostatic hyperplasia)   . COPD (chronic obstructive pulmonary disease) (HCC)    PULMOLOGIST -- Dr. Meredeth Ide.  [facility])  . Elevated PSA   . GERD (gastroesophageal reflux disease)   . History of exercise stress test    06-26-2009  w/ echo--  normal ETT and stress echo images, no ishemia, ef 65%  . History of [facility] spotted fever    Apr 2015--  no residual  . Lower urinary tract symptoms (LUTS)   . 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 Urinary hesitancy   . Weak urine stream   . Wears glasses     Past Surgical History:  Procedure Laterality Date  . GREEN LIGHT LASER TURP (TRANSURETHRAL RESECTION OF PROSTATE N/A 07/21/2015   Procedure: GREEN LIGHT LASER TURP (TRANSURETHRAL RESECTION OF PROSTATE;  Surgeon: Jerilee Field, MD;  Location: [facility];  Service: Urology;  Laterality: N/A;  . TONSILLECTOMY  as child   Family History  Problem Relation Age of Onset  . Cancer Mother        liver  . COPD Sister       ALLERGIES: Spiriva [tiotropium bromide monohydrate]  Current Outpatient Medications on File Prior to Visit  Medication Sig Dispense Refill  . albuterol (PROVENTIL HFA;VENTOLIN HFA) 108 (90 BASE) MCG/ACT inhaler Inhale 2 puffs into the lungs every 4 (four) hours as needed. 18 g 3  . budesonide-formoterol (SYMBICORT) 160-4.5 MCG/ACT inhaler Inhale 2 puffs into the lungs 2 (two) times daily. 1 Inhaler 3  . cetirizine (ZYRTEC) 10 MG tablet Take 10 mg by mouth at bedtime.     . docusate sodium (COLACE) 100 MG capsule Take 100 mg by mouth every evening.    Marland Kitchen  glucosamine-chondroitin 500-400 MG tablet Take 1 tablet by mouth 3 (three) times daily.    Marland Kitchen omeprazole (PRILOSEC) 20 MG capsule Take 20 mg by mouth as needed.    Marland Kitchen PARoxetine (PAXIL) 20 MG tablet TAKE 1 TABLET BY MOUTH ONCE DAILY 90 tablet 1   No current facility-administered medications on file prior to visit.     Social History   Tobacco Use  . Smoking status: Former Smoker    Years: 30.00    Types: Cigarettes    Last attempt to quit: 04/26/2000    Years since quitting: 17.4  . Smokeless tobacco: Never Used  Substance Use Topics  . Alcohol use: Yes    Alcohol/week: 0.6 oz    Types: 1 Cans of beer per week    Comment: occasional  . Drug use: No    Review of Systems    Constitutional: Negative for chills and fever.  HENT: Negative for congestion, ear pain and hearing loss.   Eyes: Negative for visual disturbance.  Respiratory: Negative for cough.   Cardiovascular: Negative for chest pain, palpitations and leg swelling.  Gastrointestinal: Negative for diarrhea, nausea and vomiting.  Musculoskeletal: Negative for myalgias.  Skin: Negative for rash.  Neurological: Positive for dizziness and tremors. Negative for seizures, syncope, weakness, numbness and headaches.  Hematological: Negative for adenopathy.  Psychiatric/Behavioral: Negative for confusion.      Objective:    BP 134/74 (BP Location: Left Arm, Patient Position: Standing, Cuff Size: Normal)   Pulse 83   Temp 98.6 F (37 C) (Oral)   Wt 188 lb 2 oz (85.3 kg)   SpO2 94%   BMI 26.24 kg/m   BP Readings from Last 3 Encounters:  10/20/17 134/74  08/09/17 120/64  12/27/16 122/68   Wt Readings from Last 3 Encounters:  10/20/17 188 lb 2 oz (85.3 kg)  08/09/17 184 lb (83.5 kg)  12/27/16 190 lb 12.8 oz (86.5 kg)    Physical Exam  Constitutional: He appears well-developed and well-nourished.  HENT:  Right Ear: Hearing normal.  Left Ear: Hearing normal.  Mouth/Throat: Uvula is midline, oropharynx is clear and moist and mucous membranes are normal. No posterior oropharyngeal edema or posterior oropharyngeal erythema.  Eyes: Pupils are equal, round, and reactive to light. Conjunctivae, EOM and lids are normal. Lids are everted and swept, no foreign bodies found.  Normal fundus bilaterally.  Neck: No thyroid mass and no thyromegaly present.  Cardiovascular: Regular rhythm and normal heart sounds.  Pulmonary/Chest: Effort normal and breath sounds normal. No respiratory distress. He has no wheezes. He has no rhonchi. He has no rales.  Musculoskeletal:  Bouchards nodes noted. Symmetrically enlarged DIP, PIP joints. No pain of MCP joint bilaterally.  No erythema, increase warmth, tenderness on  exam.   Lymphadenopathy:       Head (right side): No submental, no submandibular, no tonsillar, no preauricular, no posterior auricular and no occipital adenopathy present.       Head (left side): No submental, no submandibular, no tonsillar, no preauricular, no posterior auricular and no occipital adenopathy present.    He has no cervical adenopathy.    He has no axillary adenopathy.  Neurological: He is alert. He has normal strength. No cranial nerve deficit or sensory deficit. He displays a negative Romberg sign.  Reflex Scores:      Bicep reflexes are 2+ on the right side and 2+ on the left side.      Patellar reflexes are 2+ on the right side and 2+  on the left side. Grip equal and strong bilateral upper extremities. Gait strong and steady. No shuffling. Fine intention tremor noted with finger to nose maneuver. No cogwheeling.   Skin: Skin is warm and dry.  Psychiatric: He has a normal mood and affect. His speech is normal and behavior is normal.  Vitals reviewed.      Assessment & Plan:   Problem List Items Addressed This Visit      Cardiovascular and Mediastinum   Atherosclerosis of aorta (HCC)    Patient amenable to starting cholesterol medication after discussion of CVD risks.  He has had no recent cardiac evaluation, discussed at length with patient and wife.  Patient is exercising regularly without any symptoms of chest pain.  At this time, he politely declines referral to cardiology for risk stratification, possible stress testing.      Relevant Medications   pravastatin (PRAVACHOL) 20 MG tablet     Respiratory   COPD (chronic obstructive pulmonary disease) (HCC)    Pleased to see at baseline today.  Patient continues to follow with pulmonology        Musculoskeletal and Integument   Osteoarthritis    Bilateral hand symptoms most consistent with osteoarthritis.  Trial of Voltaren gel , patient will let me know if any improvement      Relevant Medications    diclofenac sodium (VOLTAREN) 1 % GEL     Other   Medicare annual wellness visit, subsequent - Primary    Patient politely declines a prostate exam today in the absence of urinary complaints.  He also does not meet criteria for CT chest screen as he quit smoking more than 15 years ago.  I reviewed labs in depth with patient today.      Vertigo    Symptoms most consistent with vertigo.  I chose to not perform Dix-Hallpike maneuver on patient today as when performing orthostatics -going from sitting sitting up, vertigo started.   We are pending MRI of the brain as the vertigo has become more frequent over the past 6 months.  Also a pending referral to ear nose and throat.  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  Patient will maintain close vigilance until he can be evaluated by ear nose and throat.        Tremor    Tremor most apparent with hands outstretched, and goal oriented behaviors such as finger to nose.  Differential includes benign essential tremor.  Patient's TSH has been normal.  Patient, wife and I jointly discussed today that the absence of worsening tremor, or new features to suggest PD, we would not consult neurology at this time.  I advised them both if continues or new symptoms develop, at that time should consult neurology.       Other Visit Diagnoses    Other peripheral vertigo, unspecified ear       Relevant Orders   MR Brain W Wo Contrast       I have discontinued Sr. Paulo Fruit. Greener Sr.'s doxycycline and predniSONE. I am also having him start on diclofenac sodium and pravastatin. Additionally, I am having him maintain his cetirizine, budesonide-formoterol, albuterol, glucosamine-chondroitin, docusate sodium, omeprazole, and PARoxetine.   Meds ordered this encounter  Medications  . diclofenac sodium (VOLTAREN) 1 % GEL    Sig: Apply 4 g topically 4 (four) times daily.    Dispense:  1 Tube     Refill:  3  Order Specific Question:   Supervising Provider    Answer:   Sherlene ShamsULLO, TERESA L [[id number]]  . pravastatin (PRAVACHOL) 20 MG tablet    Sig: Take 1 tablet (20 mg total) by mouth daily.    Dispense:  90 tablet    Refill:  3    Order Specific Question:   Supervising Provider    Answer:   Sherlene ShamsULLO, TERESA L [[id number]]    Return precautions given.   Risks, benefits, and alternatives of the medications and treatment plan prescribed today were discussed, and patient expressed understanding.   Education regarding symptom management and diagnosis given to patient on AVS.   Continue to follow with Allegra GranaArnett, Margaret G, FNP for routine health maintenance.   Michaela CornerGeorge R Schwoerer Sr. and I agreed with plan.   Rennie PlowmanMargaret Arnett, FNP

## 2017-10-20 NOTE — Assessment & Plan Note (Addendum)
Patient amenable to starting cholesterol medication after discussion of CVD risks.  He has had no recent cardiac evaluation, discussed at length with patient and wife.  Patient is exercising regularly without any symptoms of chest pain.  At this time, he politely declines referral to cardiology for risk stratification, possible stress testing.

## 2017-10-20 NOTE — Assessment & Plan Note (Signed)
Pleased to see at baseline today.  Patient continues to follow with pulmonology

## 2017-10-30 ENCOUNTER — Ambulatory Visit
Admission: RE | Admit: 2017-10-30 | Discharge: 2017-10-30 | Disposition: A | Discharge status: Home / Self Care | Payer: Medicare HMO | Source: Ambulatory Visit | Attending: Family | Admitting: Family

## 2017-10-30 DIAGNOSIS — H81399 Other peripheral vertigo, unspecified ear: Secondary | ICD-10-CM

## 2017-10-30 DIAGNOSIS — R42 Dizziness and giddiness: Secondary | ICD-10-CM | POA: Diagnosis not present

## 2017-10-30 DIAGNOSIS — G629 Polyneuropathy, unspecified: Secondary | ICD-10-CM | POA: Insufficient documentation

## 2017-10-30 MED ORDER — GADOBENATE DIMEGLUMINE 529 MG/ML IV SOLN
17.0000 mL | Freq: Once | INTRAVENOUS | Status: AC | PRN
  Administered 2017-10-30: 17 mL via INTRAVENOUS

## 2017-11-06 ENCOUNTER — Telehealth: Payer: Self-pay | Admitting: Family

## 2017-11-06 NOTE — Telephone Encounter (Signed)
Call pt  I have received results of your MRI brain.  Overall a very reassuring.  It shows:   No acute intracranial abnormality.  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  I have consulted with a neurologist - waiting on response. I want  to be sure this is more of an incidental finding versus something to be concerned about.  Please give our office a call in another week if you have not heard back from me regarding this finding.  However I just wanted you know the preliminary report looks good.  Has he an appt with ENT yet for vertigo?

## 2017-11-07 ENCOUNTER — Encounter: Payer: Self-pay | Admitting: Family

## 2017-11-08 ENCOUNTER — Encounter: Payer: Self-pay | Admitting: Family

## 2017-11-09 NOTE — Telephone Encounter (Signed)
Spoke with patient he states he will see ENT Dr Jenne CampusMcQueen  on Monday April 22nd .  He did receive your email and understood and no further questions at this time.

## 2017-11-10 NOTE — Telephone Encounter (Signed)
noted 

## 2017-11-13 DIAGNOSIS — R42 Dizziness and giddiness: Secondary | ICD-10-CM | POA: Diagnosis not present

## 2017-11-13 DIAGNOSIS — H8112 Benign paroxysmal vertigo, left ear: Secondary | ICD-10-CM | POA: Diagnosis not present

## 2017-11-20 DIAGNOSIS — H8111 Benign paroxysmal vertigo, right ear: Secondary | ICD-10-CM | POA: Diagnosis not present

## 2017-11-20 DIAGNOSIS — H8112 Benign paroxysmal vertigo, left ear: Secondary | ICD-10-CM | POA: Diagnosis not present

## 2017-11-27 DIAGNOSIS — H8111 Benign paroxysmal vertigo, right ear: Secondary | ICD-10-CM | POA: Diagnosis not present

## 2017-11-27 DIAGNOSIS — H8112 Benign paroxysmal vertigo, left ear: Secondary | ICD-10-CM | POA: Diagnosis not present

## 2017-12-04 DIAGNOSIS — H8111 Benign paroxysmal vertigo, right ear: Secondary | ICD-10-CM | POA: Diagnosis not present

## 2017-12-04 DIAGNOSIS — H8112 Benign paroxysmal vertigo, left ear: Secondary | ICD-10-CM | POA: Diagnosis not present

## 2017-12-05 DIAGNOSIS — H2512 Age-related nuclear cataract, left eye: Secondary | ICD-10-CM | POA: Diagnosis not present

## 2017-12-05 DIAGNOSIS — H2513 Age-related nuclear cataract, bilateral: Secondary | ICD-10-CM | POA: Diagnosis not present

## 2017-12-05 DIAGNOSIS — H18413 Arcus senilis, bilateral: Secondary | ICD-10-CM | POA: Diagnosis not present

## 2017-12-21 ENCOUNTER — Ambulatory Visit: Payer: Medicare HMO

## 2017-12-28 ENCOUNTER — Ambulatory Visit (INDEPENDENT_AMBULATORY_CARE_PROVIDER_SITE_OTHER): Payer: Medicare HMO

## 2017-12-28 VITALS — BP 132/64 | HR 69 | Temp 98.6°F | Resp 16 | Ht 69.0 in | Wt 188.8 lb

## 2017-12-28 DIAGNOSIS — Z0000 Encounter for general adult medical examination without abnormal findings: Secondary | ICD-10-CM | POA: Diagnosis not present

## 2017-12-28 NOTE — Patient Instructions (Addendum)
  Joseph Benson , Thank you for taking time to come for your Medicare Wellness Visit. I appreciate your ongoing commitment to your health goals. Please review the following plan we discussed and let me know if I can assist you in the future.   Follow up as needed.    Bring a copy of your Health Care Power of Attorney and/or Living Will to be scanned into chart once completed.   Have a great day!  These are the goals we discussed: Goals    . DIET - INCREASE WATER INTAKE       This is a list of the screening recommended for you and due dates:  Health Maintenance  Topic Date Due  . Flu Shot  03/01/2018  . Colon Cancer Screening  10/05/2020  . Tetanus Vaccine  11/09/2027  . Pneumonia vaccines  Completed

## 2017-12-28 NOTE — Progress Notes (Signed)
Subjective:   Joseph HOE Sr. is a 77 y.o. male who presents for Medicare Annual/Subsequent preventive examination.  Review of Systems:  No ROS.  Medicare Wellness Visit. Additional risk factors are reflected in the social history. Cardiac Risk Factors include: advanced age (>82men, >56 women);male gender     Objective:    Vitals: BP 132/64 (BP Location: Left Arm, Patient Position: Sitting, Cuff Size: Normal)   Pulse 69   Temp 98.6 F (37 C) (Oral)   Resp 16   Ht  (1.753 m)   Wt 188 lb 12.8 oz (85.6 kg)   SpO2 95%   BMI 27.88 kg/m   Body mass index is 27.88 kg/m.  Advanced Directives 12/28/2017 12/20/2016 07/21/2015  Does Patient Have a Medical Advance Directive? Yes Yes Yes  Type of Estate agent of [place];Living will Healthcare Power of [place];Living will Healthcare Power of [place];Living will  Does patient want to make changes to medical advance directive? No - Patient declined No - Patient declined No - Patient declined  Copy of Healthcare Power of Attorney in Chart? No - copy requested No - copy requested No - copy requested    Tobacco Social History   Tobacco Use  Smoking Status Former Smoker  . Years: 30.00  . Types: Cigarettes  . Last attempt to quit: 04/26/2000  . Years since quitting: 17.6  Smokeless Tobacco Never Used     Counseling given: Not Answered   Clinical Intake:  Pre-visit preparation completed: Yes  Pain : No/denies pain     Nutritional Status: BMI 25 -29 Overweight Diabetes: No  How often do you need to have someone help you when you read instructions, pamphlets, or other written materials from your doctor or pharmacy?: 1 - Never  Interpreter Needed?: No     Past Medical History:  Diagnosis Date  . Bladder outlet obstruction   . BPH (benign prostatic hyperplasia)   . COPD (chronic obstructive pulmonary disease) (HCC)    PULMOLOGIST -- Dr. Meredeth Ide.  [facility])  . Elevated PSA   . GERD  (gastroesophageal reflux disease)   . History of exercise stress test    06-26-2009  w/ echo--  normal ETT and stress echo images, no ishemia, ef 65%  . History of [facility] spotted fever    Apr 2015--  no residual  . Lower urinary tract symptoms (LUTS)   . 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 Urinary hesitancy   . Weak urine stream   . Wears glasses    Past Surgical History:  Procedure Laterality Date  . GREEN LIGHT LASER TURP (TRANSURETHRAL RESECTION OF PROSTATE N/A 07/21/2015   Procedure: GREEN LIGHT LASER TURP (TRANSURETHRAL RESECTION OF PROSTATE;  Surgeon: Jerilee Field, MD;  Location: [facility];  Service: Urology;  Laterality: N/A;  . TONSILLECTOMY  as child   Family History  Problem Relation Age of Onset  . Cancer Mother        liver  . COPD Sister   . ADD / ADHD Grandchild    Social History   Socioeconomic History  . Marital status: Married    Spouse name: Not on file  . Number of children: Not on file  . Years of education: Not on file  . Highest education level: Not on file  Occupational History  . Not on file  Social Needs  . Financial resource strain: Not hard at all  . Food insecurity:  Worry: Never true    Inability: Never true  . Transportation needs:    Medical: No    Non-medical: No  Tobacco Use  . Smoking status: Former Smoker    Years: 30.00    Types: Cigarettes    Last attempt to quit: 04/26/2000    Years since quitting: 17.6  . Smokeless tobacco: Never Used  Substance and Sexual Activity  . Alcohol use: Yes    Alcohol/week: 0.6 oz    Types: 1 Cans of beer per week    Comment: occasional  . Drug use: No  . Sexual activity: Never  Lifestyle  . Physical activity:    Days per week: 3 days    Minutes per session: 30 min  . Stress: Not at all  Relationships  . Social connections:    Talks on phone: Not on file    Gets together: Not on file    Attends religious service: Not on file      Active member of club or organization: Not on file    Attends meetings of clubs or organizations: Not on file    Relationship status: Not on file  Other Topics Concern  . Not on file  Social History Narrative   Retired from [employer], professor. Also worked for [employer] for 26 years.    Paints for fun   Wife is patient    Outpatient Encounter Medications as of 12/28/2017  Medication Sig  . albuterol (PROVENTIL HFA;VENTOLIN HFA) 108 (90 BASE) MCG/ACT inhaler Inhale 2 puffs into the lungs every 4 (four) hours as needed.  . budesonide-formoterol (SYMBICORT) 160-4.5 MCG/ACT inhaler Inhale 2 puffs into the lungs 2 (two) times daily.  . cetirizine (ZYRTEC) 10 MG tablet Take 10 mg by mouth at bedtime.   . diclofenac sodium (VOLTAREN) 1 % GEL Apply 4 g topically 4 (four) times daily.  Marland Kitchen docusate sodium (COLACE) 100 MG capsule Take 100 mg by mouth every evening.  Marland Kitchen glucosamine-chondroitin 500-400 MG tablet Take 1 tablet by mouth 3 (three) times daily.  Marland Kitchen omeprazole (PRILOSEC) 20 MG capsule Take 20 mg by mouth as needed.  Marland Kitchen PARoxetine (PAXIL) 20 MG tablet TAKE 1 TABLET BY MOUTH ONCE DAILY  . pravastatin (PRAVACHOL) 20 MG tablet Take 1 tablet (20 mg total) by mouth daily.   No facility-administered encounter medications on file as of 12/28/2017.     Activities of Daily Living In your present state of health, do you have any difficulty performing the following activities: 12/28/2017  Hearing? N  Vision? N  Difficulty concentrating or making decisions? N  Walking or climbing stairs? N  Dressing or bathing? N  Doing errands, shopping? N  Preparing Food and eating ? N  Using the Toilet? N  In the past six months, have you accidently leaked urine? N  Do you have problems with loss of bowel control? N  Managing your Medications? N  Managing your Finances? N  Housekeeping or managing your Housekeeping? N  Some recent data might be hidden    Patient Care Team: Allegra Grana,  FNP as PCP - General (Family Medicine)   Assessment:   This is a routine wellness examination for Ben.  The goal of the wellness visit is to assist the patient how to close the gaps in care and create a preventative care plan for the patient.   The roster of all physicians providing medical care to patient is listed in the Snapshot section of the chart.  Osteoporosis risk reviewed.  Safety issues reviewed; Smoke and carbon monoxide detectors in the home. No firearms in the home. Wears seatbelts when driving or riding with others. No violence in the home.  They do not have excessive sun exposure.  Discussed the need for sun protection: hats, long sleeves and the use of sunscreen if there is significant sun exposure.  Patient is alert, normal appearance, oriented to person/place/and time.  Correctly identified the president of the [place] and recalls of 2/3 words. Performs simple calculations and can read correct time from watch face. Displays appropriate judgement.  No new identified risk were noted.  No failures at ADL's or IADL's.  BMI- discussed the importance of a healthy diet, water intake and the benefits of aerobic exercise. Educational material provided.   24 hour diet recall: Low carb   Dental- UTD.  Eye- Visual acuity not assessed per patient preference since they have regular follow up with the ophthalmologist.  Wears corrective lenses.  Sleep patterns- Sleeps without issues.   Health maintenance gaps- closed.  Patient Concerns: None at this time. Follow up with PCP as needed.  Exercise Activities and Dietary recommendations Current Exercise Habits: Structured exercise class, Type of exercise: calisthenics, Time (Minutes): 30, Frequency (Times/Week): 3, Weekly Exercise (Minutes/Week): 90, Intensity: Mild  Goals    . DIET - INCREASE WATER INTAKE       Fall Risk Fall Risk  12/28/2017 12/20/2016 09/19/2016 05/18/2016 02/16/2016  Falls in the past year? No No No  No No   Depression Screen PHQ 2/9 Scores 12/28/2017 12/20/2016 09/19/2016 05/18/2016  PHQ - 2 Score 0 0 0 0  PHQ- 9 Score - 0 - -    Cognitive Function MMSE - Mini Mental State Exam 12/28/2017 12/20/2016  Orientation to time 5 5  Orientation to Place 5 5  Registration 3 3  Attention/ Calculation 5 5  Recall 3 3  Language- name 2 objects 2 2  Language- repeat 1 1  Language- follow 3 step command 3 3  Language- read & follow direction 1 1  Write a sentence 1 1  Copy design 1 1  Total score 30 30        Immunization History  Administered Date(s) Administered  . Influenza Split 06/02/2011, 05/17/2014  . Influenza Whole 05/30/2013  . Influenza, High Dose Seasonal PF 05/18/2016, 04/17/2017  . Influenza-Unspecified 04/27/2015  . Pneumococcal Conjugate-13 07/31/2014  . Pneumococcal Polysaccharide-23 08/01/2008, 08/31/2012  . Tdap 11/08/2017  . Zoster 07/07/2014   Screening Tests Health Maintenance  Topic Date Due  . INFLUENZA VACCINE  03/01/2018  . COLONOSCOPY  10/05/2020  . TETANUS/TDAP  11/09/2027  . PNA vac Low Risk Adult  Completed       Plan:    End of life planning; Advance aging; Advanced directives discussed. Copy of current HCPOA/Living Will requested.    I have personally reviewed and noted the following in the patient's chart:   . Medical and social history . Use of alcohol, tobacco or illicit drugs  . Current medications and supplements . Functional ability and status . Nutritional status . Physical activity . Advanced directives . List of other physicians . Hospitalizations, surgeries, and ER visits in previous 12 months . Vitals . Screenings to include cognitive, depression, and falls . Referrals and appointments  In addition, I have reviewed and discussed with patient certain preventive protocols, quality metrics, and best practice recommendations. A written personalized care plan for preventive services as well as general preventive health  recommendations were provided to patient.  Ashok Pall, LPN  11/07/8117   Reviewed above information.  Agree with assessment and plan.   Dr Lorin Picket

## 2018-01-15 DIAGNOSIS — H25812 Combined forms of age-related cataract, left eye: Secondary | ICD-10-CM | POA: Diagnosis not present

## 2018-01-15 DIAGNOSIS — H2512 Age-related nuclear cataract, left eye: Secondary | ICD-10-CM | POA: Diagnosis not present

## 2018-01-15 DIAGNOSIS — H52202 Unspecified astigmatism, left eye: Secondary | ICD-10-CM | POA: Diagnosis not present

## 2018-01-16 DIAGNOSIS — Z961 Presence of intraocular lens: Secondary | ICD-10-CM | POA: Diagnosis not present

## 2018-01-16 DIAGNOSIS — H2511 Age-related nuclear cataract, right eye: Secondary | ICD-10-CM | POA: Diagnosis not present

## 2018-01-22 ENCOUNTER — Other Ambulatory Visit: Payer: Self-pay | Admitting: Family

## 2018-02-05 DIAGNOSIS — H2511 Age-related nuclear cataract, right eye: Secondary | ICD-10-CM | POA: Diagnosis not present

## 2018-02-05 DIAGNOSIS — H52201 Unspecified astigmatism, right eye: Secondary | ICD-10-CM | POA: Diagnosis not present

## 2018-02-05 DIAGNOSIS — H25811 Combined forms of age-related cataract, right eye: Secondary | ICD-10-CM | POA: Diagnosis not present

## 2018-03-27 DIAGNOSIS — J439 Emphysema, unspecified: Secondary | ICD-10-CM | POA: Diagnosis not present

## 2018-03-27 DIAGNOSIS — R0609 Other forms of dyspnea: Secondary | ICD-10-CM | POA: Diagnosis not present

## 2018-04-25 DIAGNOSIS — R69 Illness, unspecified: Secondary | ICD-10-CM | POA: Diagnosis not present

## 2018-06-26 DIAGNOSIS — R05 Cough: Secondary | ICD-10-CM | POA: Diagnosis not present

## 2018-07-18 ENCOUNTER — Telehealth: Payer: Self-pay | Admitting: *Deleted

## 2018-07-18 NOTE — Telephone Encounter (Signed)
Copied from CRM [id number]. Topic: General - Other >> Jul 18, 2018 11:08 AM Maia Pettiesrtiz, Kristie S wrote: Reason for CRM: pt wife called to RS appt for AWV with Denisa and Claris CheMargaret. She is wanting to know if pt needs to have labs drawn to recheck cholesterol before that time since pt was put on pravastatin 10/20/17. Please advise. >> Jul 18, 2018 11:16 AM Maia Pettiesrtiz, Kristie S wrote: Pts pulmonary [name] discontinued Symbicort and added Trelegy Ellipta. Britta MccreedyBarbara wanted med list to be updated.

## 2018-07-18 NOTE — Telephone Encounter (Signed)
Please advise 

## 2018-07-20 NOTE — Telephone Encounter (Signed)
Call patient Please advise I last saw him in April of this year.  He is due for follow-up with me. Denisa is wellness.     You may update his medical list and also schedule follow-up with me.    Please advised to make a morning appointment so we can get fasting labs

## 2018-07-23 NOTE — Telephone Encounter (Signed)
I have spoke with patient's wife Britta MccreedyBarbara & reschedule his appointment to see Claris CheMargaret to April. He is scheduled 11/05/18 at 8am so he can also have fasting labs.

## 2018-07-31 ENCOUNTER — Other Ambulatory Visit: Payer: Self-pay | Admitting: Family

## 2018-08-31 ENCOUNTER — Ambulatory Visit (INDEPENDENT_AMBULATORY_CARE_PROVIDER_SITE_OTHER): Payer: Medicare HMO | Admitting: Family Medicine

## 2018-08-31 ENCOUNTER — Encounter: Payer: Self-pay | Admitting: Family Medicine

## 2018-08-31 VITALS — BP 142/90 | HR 93 | Temp 98.2°F | Ht 69.0 in | Wt 189.4 lb

## 2018-08-31 DIAGNOSIS — M5441 Lumbago with sciatica, right side: Secondary | ICD-10-CM

## 2018-08-31 DIAGNOSIS — G8929 Other chronic pain: Secondary | ICD-10-CM

## 2018-08-31 DIAGNOSIS — M5442 Lumbago with sciatica, left side: Secondary | ICD-10-CM | POA: Diagnosis not present

## 2018-08-31 MED ORDER — CYCLOBENZAPRINE HCL 5 MG PO TABS: ORAL_TABLET | ORAL | 9 refills | Status: DC

## 2018-08-31 MED ORDER — HYDROCODONE-ACETAMINOPHEN 5-325 MG PO TABS
1.0000 | ORAL_TABLET | Freq: Three times a day (TID) | ORAL | 0 refills | Status: DC | PRN
Start: 2018-08-31 — End: 2018-11-05

## 2018-08-31 MED ORDER — METHYLPREDNISOLONE ACETATE 40 MG/ML IJ SUSP
40.0000 mg | Freq: Once | INTRAMUSCULAR | Status: AC
  Administered 2018-08-31: 40 mg via INTRAMUSCULAR

## 2018-08-31 MED ORDER — PREDNISONE 10 MG (21) PO TBPK: ORAL_TABLET | ORAL | 0 refills | Status: DC

## 2018-08-31 MED ORDER — KETOROLAC TROMETHAMINE 60 MG/2ML IM SOLN
60.0000 mg | Freq: Once | INTRAMUSCULAR | Status: AC
  Administered 2018-08-31: 60 mg via INTRAMUSCULAR

## 2018-08-31 NOTE — Progress Notes (Signed)
Subjective:    Patient ID: Joseph TURIN Sr., male    DOB: [birth year], 78 y.o.   MRN: [id number]  HPI   Patient presents to clinic complaining of bilateral low back pain that radiates down into tops of both legs.  Patient does have a history of low back pain, states he had an MRI done in 2015 which then was followed with back injections at [facility].  States the back injections were very helpful to him, and he has not had an issue with back pain flareup in the last 5 years.  States the pain was so bad the last 2 days, barely got any sleep.  Wife gave him a muscle relaxer and a hydrocodone that they had leftover from a prior surgery.  Patient states the combination of the medicines finally made him able to stretch out and get a few hours of sleep.  Upon waking this morning, back pain feels slightly improved but continues to feel the aching radiate down into the tops of legs.  Denies fever or chills.  Denies saddle anesthesia.  Denies loss of bowel or bladder control.  Patient Active Problem List   Diagnosis Date Noted  . Vertigo 10/20/2017  . Osteoarthritis 10/20/2017  . Tremor 10/20/2017  . Atherosclerosis of aorta (HCC) 10/20/2017  . COPD exacerbation (HCC) 08/09/2017  . Elevated blood pressure reading 12/27/2016  . Hyperlipidemia 08/19/2015  . Medicare annual wellness visit, subsequent 07/31/2014  . Low back pain 11/28/2013  . History of poliomyelitis 11/21/2012  . Subclinical hyperthyroidism 08/31/2012  . Depression 10/24/2011  . COPD (chronic obstructive pulmonary disease) (HCC) 04/27/2011   Social History   Tobacco Use  . Smoking status: Former Smoker    Years: 30.00    Types: Cigarettes    Last attempt to quit: 04/26/2000    Years since quitting: 18.3  . Smokeless tobacco: Never Used  Substance Use Topics  . Alcohol use: Yes    Alcohol/week: 1.0 standard drinks    Types: 1 Cans of beer per week    Comment: occasional   Review of Systems  Constitutional:  Negative for chills, fatigue and fever.  HENT: Negative for congestion, ear pain, sinus pain and sore throat.   Eyes: Negative.   Respiratory: Negative for cough, shortness of breath and wheezing.   Cardiovascular: Negative for chest pain, palpitations and leg swelling.  Gastrointestinal: Negative for abdominal pain, diarrhea, nausea and vomiting.  Genitourinary: Negative for dysuria, frequency and urgency.  Musculoskeletal: +low back pain Skin: Negative for color change, pallor and rash.  Neurological: Negative for syncope, light-headedness and headaches.  Psychiatric/Behavioral: The patient is not nervous/anxious.       Objective:   Physical Exam Vitals signs and nursing note reviewed.  Constitutional:      General: He is not in acute distress.    Appearance: He is not toxic-appearing.  HENT:     Head: Normocephalic and atraumatic.  Eyes:     General: No scleral icterus.    Extraocular Movements: Extraocular movements intact.     Pupils: Pupils are equal, round, and reactive to light.  Neck:     Musculoskeletal: Normal range of motion and neck supple. No neck rigidity.  Cardiovascular:     Rate and Rhythm: Normal rate and regular rhythm.     Heart sounds: Normal heart sounds.  Pulmonary:     Effort: Pulmonary effort is normal. No respiratory distress.     Breath sounds: Normal breath sounds.  Musculoskeletal:  Lumbar back: He exhibits tenderness.     Comments: Bilateral low back tenderness radiating into bilateral buttocks and bilateral legs.  Dorsi plantar strength equal and strong.  Quadricep strength equal and strong.  Light touch sensation intact in bilateral lower extremities.  Patient is able to bend side to side forward and backward and twist side to side however these range of motion's do cause a pulling sensation and pain in low back.  Skin:    General: Skin is warm and dry.     Coloration: Skin is not pale.  Neurological:     Mental Status: He is alert and  oriented to person, place, and time.  Psychiatric:        Mood and Affect: Mood normal.        Behavior: Behavior normal.    Today's Vitals   08/31/18 1031  BP: (!) 142/90  Pulse: 93  Temp: 98.2 F (36.8 C)  TempSrc: Oral  SpO2: 94%  Weight: 189 lb 6.4 oz (85.9 kg)  Height: 5\' 9"  (1.753 m)   Body mass index is 27.97 kg/m.     Assessment & Plan:    A total of 25  minutes were spent face-to-face with the patient during this encounter and over half of that time was spent on counseling and coordination of care. The patient was counseled on back pain, medications given in clinic today and Rx sent in, our plan for MRI and referral to neurosurgery.   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  Prescribed steroid taper.  He will use muscle relaxer as needed for muscle spasm and hydrocodone if needed for more severe pain.  Patient aware that muscle relaxer and hydrocodone can cause drowsiness, so he is to not take these prior to driving.  Patient's wife is a retired Engineer, civil (consulting) and is very careful with medications; she keeps a close eye on the medications and timing between doses.  MRI of lumbar spine placed as his last MRI was in 2015.  Referral to neurosurgery also placed due to patient having success in the past at [facility], he would like to see them again.  Administrations This Visit    ketorolac (TORADOL) injection 60 mg    Admin Date 08/31/2018 Action Given Dose 60 mg Route Intramuscular Administered By Juline Patch, CMA       methylPREDNISolone acetate (DEPO-MEDROL) injection 40 mg    Admin Date 08/31/2018 Action Given Dose 40 mg Route Intramuscular Administered By Juline Patch, CMA          Patient aware he will be contacted in regards to MRI appointment and neurosurgery referral.  He will keep currently scheduled follow-up with PCP as planned and  return to clinic sooner if current symptoms persist or worsen or new issues arise.

## 2018-09-20 ENCOUNTER — Ambulatory Visit
Admission: RE | Admit: 2018-09-20 | Discharge: 2018-09-20 | Disposition: A | Discharge status: Home / Self Care | Payer: Medicare HMO | Source: Ambulatory Visit | Attending: Family Medicine | Admitting: Family Medicine

## 2018-09-20 DIAGNOSIS — M5442 Lumbago with sciatica, left side: Secondary | ICD-10-CM | POA: Insufficient documentation

## 2018-09-20 DIAGNOSIS — M5441 Lumbago with sciatica, right side: Secondary | ICD-10-CM | POA: Diagnosis not present

## 2018-09-20 DIAGNOSIS — G8929 Other chronic pain: Secondary | ICD-10-CM | POA: Insufficient documentation

## 2018-09-20 DIAGNOSIS — M545 Low back pain: Secondary | ICD-10-CM | POA: Diagnosis not present

## 2018-10-04 ENCOUNTER — Other Ambulatory Visit: Payer: Self-pay | Admitting: Family

## 2018-10-04 DIAGNOSIS — I700 Atherosclerosis of aorta: Secondary | ICD-10-CM

## 2018-10-09 ENCOUNTER — Other Ambulatory Visit: Payer: Self-pay | Admitting: Family

## 2018-10-09 DIAGNOSIS — I700 Atherosclerosis of aorta: Secondary | ICD-10-CM

## 2018-10-16 DIAGNOSIS — M48062 Spinal stenosis, lumbar region with neurogenic claudication: Secondary | ICD-10-CM | POA: Diagnosis not present

## 2018-10-16 DIAGNOSIS — M5136 Other intervertebral disc degeneration, lumbar region: Secondary | ICD-10-CM | POA: Diagnosis not present

## 2018-10-16 DIAGNOSIS — M47816 Spondylosis without myelopathy or radiculopathy, lumbar region: Secondary | ICD-10-CM | POA: Diagnosis not present

## 2018-10-16 DIAGNOSIS — M549 Dorsalgia, unspecified: Secondary | ICD-10-CM | POA: Diagnosis not present

## 2018-10-16 DIAGNOSIS — M546 Pain in thoracic spine: Secondary | ICD-10-CM | POA: Diagnosis not present

## 2018-10-16 DIAGNOSIS — M4316 Spondylolisthesis, lumbar region: Secondary | ICD-10-CM | POA: Diagnosis not present

## 2018-10-21 ENCOUNTER — Encounter: Payer: Self-pay | Admitting: Family

## 2018-10-31 ENCOUNTER — Telehealth: Payer: Self-pay

## 2018-10-31 NOTE — Telephone Encounter (Signed)
Pt's wife called asking about cancelling pt's appt on 11/05/18 due pt having COPD and does not want to bring pt out during this time.  Offered a virtual appt.  Pt is ok w/ virtual appt but wants to cancel fasting labs.  Pt does have a laptop w/ a camera.  Pt's email address:  [email or web address]   Please change upcoming ov on 11/05/18 @ 8:00 am to webex visit.   Office can contact pt's wife at [phone].

## 2018-11-02 NOTE — Telephone Encounter (Signed)
Patient did not receive webex invite. He is requesting it be resent. Email address on chart is correct.

## 2018-11-02 NOTE — Telephone Encounter (Signed)
I called & verified email address with patient. I have resent invitation & conformed that patient received.

## 2018-11-02 NOTE — Telephone Encounter (Signed)
I called patient & left detailed VM that I has sent email for webex invite. I wanted to make sure that he received & was able to download.

## 2018-11-05 ENCOUNTER — Encounter: Payer: Self-pay | Admitting: Family

## 2018-11-05 ENCOUNTER — Ambulatory Visit (INDEPENDENT_AMBULATORY_CARE_PROVIDER_SITE_OTHER): Payer: Medicare HMO | Admitting: Family

## 2018-11-05 DIAGNOSIS — G8929 Other chronic pain: Secondary | ICD-10-CM | POA: Diagnosis not present

## 2018-11-05 DIAGNOSIS — E785 Hyperlipidemia, unspecified: Secondary | ICD-10-CM

## 2018-11-05 DIAGNOSIS — M5441 Lumbago with sciatica, right side: Secondary | ICD-10-CM | POA: Diagnosis not present

## 2018-11-05 DIAGNOSIS — F3342 Major depressive disorder, recurrent, in full remission: Secondary | ICD-10-CM

## 2018-11-05 DIAGNOSIS — R69 Illness, unspecified: Secondary | ICD-10-CM | POA: Diagnosis not present

## 2018-11-05 DIAGNOSIS — M5442 Lumbago with sciatica, left side: Secondary | ICD-10-CM | POA: Diagnosis not present

## 2018-11-05 DIAGNOSIS — J449 Chronic obstructive pulmonary disease, unspecified: Secondary | ICD-10-CM

## 2018-11-05 NOTE — Assessment & Plan Note (Signed)
Doing well on Paxil.  Declines any changes to regimen.  Will continue.

## 2018-11-05 NOTE — Assessment & Plan Note (Signed)
Compliant medication, pending labs.

## 2018-11-05 NOTE — Patient Instructions (Addendum)
So glad to speak with you both is this morning.   Please  take care and stay home ,safe.   Let me know if anything at all.

## 2018-11-05 NOTE — Assessment & Plan Note (Signed)
Stable at baseline. Will follow 

## 2018-11-05 NOTE — Progress Notes (Addendum)
This visit type was conducted due to national recommendations for restrictions regarding the COVID-19 pandemic (e.g. social distancing).  This format is felt to be most appropriate for this patient at this time.  All issues noted in this document were discussed and addressed.  No physical exam was performed (except for noted visual exam findings with Video Visits).  Interactive audio and video telecommunications were attempted between this provider and patient, however failed, due to patient having technical difficulties or patient did not have access to video capability.  We continued and completed visit with audio only.   Virtual Visit via Video Note  I connected with@ on 11/05/18 at  8:00 AM EDT by a video enabled telemedicine application and verified that I am speaking with the correct person using two identifiers.  Location patient: home Location provider:work Persons participating in the virtual visit: patient, provider, [atient's wife  I discussed the limitations of evaluation and management by telemedicine and the availability of in person appointments. The patient expressed understanding and agreed to proceed.   HPI: Feels well today.  No complaints.  Accompanied by wife.   Staying home at this time.  COPD- at baseline. Cough is at baseline. No increases purulence.  No SOB, fever. Using Trelegy. Following with pulmonology.   Low back pain-Improved.  has seen [facility], Dr Newell Coral however since pain is improved, has declined further intervention at this time.  HLD-compliant medication.  Depression- doing well on paxil.  No interest in changing dose, regimen.  No si/hi.   ROS: See pertinent positives and negatives per HPI.  Past Medical History:  Diagnosis Date  . Bladder outlet obstruction   . BPH (benign prostatic hyperplasia)   . COPD (chronic obstructive pulmonary disease) (HCC)    PULMOLOGIST -- Dr. Meredeth Ide.  [facility])  . Elevated PSA   . GERD  (gastroesophageal reflux disease)   . History of exercise stress test    06-26-2009  w/ echo--  normal ETT and stress echo images, no ishemia, ef 65%  . History of [facility] spotted fever    Apr 2015--  no residual  . Lower urinary tract symptoms (LUTS)   . 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 Urinary hesitancy   . Weak urine stream   . Wears glasses     Past Surgical History:  Procedure Laterality Date  . GREEN LIGHT LASER TURP (TRANSURETHRAL RESECTION OF PROSTATE N/A 07/21/2015   Procedure: GREEN LIGHT LASER TURP (TRANSURETHRAL RESECTION OF PROSTATE;  Surgeon: Jerilee Field, MD;  Location: [facility];  Service: Urology;  Laterality: N/A;  . TONSILLECTOMY  as child    Family History  Problem Relation Age of Onset  . Cancer Mother        liver  . COPD Sister   . ADD / ADHD Grandchild     SOCIAL HX: former smoker   Current Outpatient Medications:  .  albuterol (PROVENTIL HFA;VENTOLIN HFA) 108 (90 BASE) MCG/ACT inhaler, Inhale 2 puffs into the lungs every 4 (four) hours as needed., Disp: 18 g, Rfl: 3 .  cetirizine (ZYRTEC) 10 MG tablet, Take 10 mg by mouth at bedtime. , Disp: , Rfl:  .  docusate sodium (COLACE) 100 MG capsule, Take 100 mg by mouth every evening., Disp: , Rfl:  .  glucosamine-chondroitin 500-400 MG tablet, Take 1 tablet by mouth 3 (three) times daily., Disp: , Rfl:  .  omeprazole (PRILOSEC) 20 MG capsule, Take 20 mg  by mouth as needed., Disp: , Rfl:  .  PARoxetine (PAXIL) 20 MG tablet, TAKE 1 TABLET BY MOUTH ONCE A DAY, Disp: 90 tablet, Rfl: 1 .  pravastatin (PRAVACHOL) 20 MG tablet, TAKE 1 TABLET BY MOUTH ONCE A DAY, Disp: 90 tablet, Rfl: 1 .  TRELEGY ELLIPTA 100-62.5-25 MCG/INH AEPB, Inhale 1 puff into the lungs daily. , Disp: , Rfl:  .  diclofenac sodium (VOLTAREN) 1 % GEL, Apply 4 g topically 4 (four) times daily. (Patient not taking: Reported on 11/05/2018), Disp: 1 Tube, Rfl: 3   ASSESSMENT AND  PLAN:  Discussed the following assessment and plan:  Chronic obstructive pulmonary disease, unspecified COPD type (HCC) - Plan: CBC with Differential/Platelet, Comprehensive metabolic panel, Hemoglobin A1c, Lipid panel, TSH, VITAMIN D 25 Hydroxy (Vit-D Deficiency, Fractures), PSA  Recurrent major depressive disorder, in full remission (HCC)  Hyperlipidemia, unspecified hyperlipidemia type  Chronic bilateral low back pain with bilateral sciatica  Problem List Items Addressed This Visit      Respiratory   COPD (chronic obstructive pulmonary disease) (HCC) - Primary    Stable at baseline. Will follow.       Relevant Medications   TRELEGY ELLIPTA 100-62.5-25 MCG/INH AEPB   Other Relevant Orders   CBC with Differential/Platelet   Comprehensive metabolic panel   Hemoglobin A1c   Lipid panel   TSH   VITAMIN D 25 Hydroxy (Vit-D Deficiency, Fractures)   PSA     Other   Depression (Chronic)    Doing well on Paxil.  Declines any changes to regimen.  Will continue.      Hyperlipidemia (Chronic)    Compliant medication, pending labs.      Low back pain    Improved.  Patient currently established with neurosurgery.  Will follow.           I discussed the assessment and treatment plan with the patient. The patient was provided an opportunity to ask questions and all were answered. The patient agreed with the plan and demonstrated an understanding of the instructions.   The patient was advised to call back or seek an in-person evaluation if the symptoms worsen or if the condition fails to improve as anticipated.  I spent 15 min was spent speaking with patient.     Rennie PlowmanMargaret , FNP

## 2018-11-05 NOTE — Assessment & Plan Note (Signed)
Improved.  Patient currently established with neurosurgery.  Will follow.

## 2018-11-05 NOTE — Addendum Note (Signed)
Addended by: Allegra Grana on: 11/05/2018 08:40 AM   Modules accepted: Level of Service

## 2018-12-25 DIAGNOSIS — R0600 Dyspnea, unspecified: Secondary | ICD-10-CM | POA: Diagnosis not present

## 2018-12-25 DIAGNOSIS — J449 Chronic obstructive pulmonary disease, unspecified: Secondary | ICD-10-CM | POA: Diagnosis not present

## 2019-01-02 ENCOUNTER — Ambulatory Visit: Payer: Medicare HMO | Admitting: Family

## 2019-01-02 ENCOUNTER — Other Ambulatory Visit: Payer: Self-pay

## 2019-01-02 ENCOUNTER — Ambulatory Visit: Payer: Medicare HMO

## 2019-01-02 ENCOUNTER — Telehealth: Payer: Self-pay | Admitting: Family

## 2019-01-02 ENCOUNTER — Ambulatory Visit (INDEPENDENT_AMBULATORY_CARE_PROVIDER_SITE_OTHER): Payer: Medicare HMO

## 2019-01-02 VITALS — BP 143/75 | HR 74 | Resp 16 | Ht 69.0 in | Wt 188.0 lb

## 2019-01-02 DIAGNOSIS — Z0000 Encounter for general adult medical examination without abnormal findings: Secondary | ICD-10-CM | POA: Diagnosis not present

## 2019-01-02 NOTE — Patient Instructions (Addendum)
  Joseph Benson , Thank you for taking time to come for your Medicare Wellness Visit. I appreciate your ongoing commitment to your health goals. Please review the following plan we discussed and let me know if I can assist you in the future.   These are the goals we discussed: Goals      Patient Stated   . Increase physical activity (pt-stated)     Resume exercises at the gym  Use recumbent bike twice a week  Cardio exercises        This is a list of the screening recommended for you and due dates:  Health Maintenance  Topic Date Due  . Flu Shot  03/02/2019  . Tetanus Vaccine  11/09/2027  . Pneumonia vaccines  Completed

## 2019-01-02 NOTE — Telephone Encounter (Signed)
Call pt He met with denisa and had memory concerns   offer appointment for memory concerns. 

## 2019-01-02 NOTE — Telephone Encounter (Signed)
I have made patient Doxy on Friday 01/04/19.

## 2019-01-02 NOTE — Progress Notes (Signed)
Subjective:   Joseph GERVASI Sr. is a 78 y.o. male who presents for Medicare Annual/Subsequent preventive examination.  Review of Systems:  No ROS.  Medicare Wellness Virtual Visit.  Visual/audio telehealth visit. Vital signs provided via patient.  See social history for additional risk factors.  Cardiac Risk Factors include: advanced age (>28men, >22 women);male gender     Objective:    Vitals: BP (!) 143/75 (BP Location: Left Arm, Patient Position: Sitting, Cuff Size: Normal) Comment: Deferred due to virtual visit  Pulse 74   Resp 16   Ht  (1.753 m)   Wt 188 lb (85.3 kg)   SpO2 96%   BMI 27.76 kg/m   Body mass index is 27.76 kg/m.  Advanced Directives 01/02/2019 12/28/2017 12/20/2016 07/21/2015  Does Patient Have a Medical Advance Directive? No Yes Yes Yes  Type of Advance Directive - Healthcare Power of [place];Living will Healthcare Power of [place];Living will Healthcare Power of [place];Living will  Does patient want to make changes to medical advance directive? - No - Patient declined No - Patient declined No - Patient declined  Copy of Healthcare Power of Attorney in Chart? - No - copy requested No - copy requested No - copy requested  Would patient like information on creating a medical advance directive? Yes (MAU/Ambulatory/Procedural Areas - Information given) - - -    Tobacco Social History   Tobacco Use  Smoking Status Former Smoker  . Years: 30.00  . Types: Cigarettes  . Last attempt to quit: 04/26/2000  . Years since quitting: 18.6  Smokeless Tobacco Never Used     Counseling given: Not Answered   Clinical Intake:  Pre-visit preparation completed: Yes        Diabetes: No  How often do you need to have someone help you when you read instructions, pamphlets, or other written materials from your doctor or pharmacy?: 1 - Never  Interpreter Needed?: No     Past Medical History:  Diagnosis Date  . Bladder outlet obstruction   . BPH  (benign prostatic hyperplasia)   . COPD (chronic obstructive pulmonary disease) (HCC)    PULMOLOGIST -- Dr. Meredeth Ide.  [facility])  . Elevated PSA   . GERD (gastroesophageal reflux disease)   . History of exercise stress test    06-26-2009  w/ echo--  normal ETT and stress echo images, no ishemia, ef 65%  . History of [facility] spotted fever    Apr 2015--  no residual  . Lower urinary tract symptoms (LUTS)   . 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 Urinary hesitancy   . Weak urine stream   . Wears glasses    Past Surgical History:  Procedure Laterality Date  . GREEN LIGHT LASER TURP (TRANSURETHRAL RESECTION OF PROSTATE N/A 07/21/2015   Procedure: GREEN LIGHT LASER TURP (TRANSURETHRAL RESECTION OF PROSTATE;  Surgeon: Jerilee Field, MD;  Location: [facility];  Service: Urology;  Laterality: N/A;  . TONSILLECTOMY  as child   Family History  Problem Relation Age of Onset  . Cancer Mother        liver  . COPD Sister   . ADD / ADHD Grandchild    Social History   Socioeconomic History  . Marital status: Married    Spouse name: Not on file  . Number of children: Not on file  . Years of education: Not on file  . Highest education level: Not on file  Occupational History  . Not on file  Social Needs  . Financial resource strain: Not hard at all  . Food insecurity:    Worry: Never true    Inability: Never true  . Transportation needs:    Medical: No    Non-medical: No  Tobacco Use  . Smoking status: Former Smoker    Years: 30.00    Types: Cigarettes    Last attempt to quit: 04/26/2000    Years since quitting: 18.6  . Smokeless tobacco: Never Used  Substance and Sexual Activity  . Alcohol use: Yes    Alcohol/week: 1.0 standard drinks    Types: 1 Cans of beer per week    Comment: occasional  . Drug use: No  . Sexual activity: Never  Lifestyle  . Physical activity:    Days per week: 3 days    Minutes per session:  30 min  . Stress: Not at all  Relationships  . Social connections:    Talks on phone: Not on file    Gets together: Not on file    Attends religious service: Not on file    Active member of club or organization: Not on file    Attends meetings of clubs or organizations: Not on file    Relationship status: Not on file  Other Topics Concern  . Not on file  Social History Narrative   Retired from [employer], professor. Also worked for [employer] for 26 years.    Paints for fun   Wife is patient    Outpatient Encounter Medications as of 01/02/2019  Medication Sig  . albuterol (PROVENTIL HFA;VENTOLIN HFA) 108 (90 BASE) MCG/ACT inhaler Inhale 2 puffs into the lungs every 4 (four) hours as needed.  . cetirizine (ZYRTEC) 10 MG tablet Take 10 mg by mouth at bedtime.   . diclofenac sodium (VOLTAREN) 1 % GEL Apply 4 g topically 4 (four) times daily.  Marland Kitchen. docusate sodium (COLACE) 100 MG capsule Take 100 mg by mouth every evening.  Marland Kitchen. glucosamine-chondroitin 500-400 MG tablet Take 1 tablet by mouth 3 (three) times daily.  Marland Kitchen. omeprazole (PRILOSEC) 20 MG capsule Take 20 mg by mouth as needed.  Marland Kitchen. PARoxetine (PAXIL) 20 MG tablet TAKE 1 TABLET BY MOUTH ONCE A DAY  . pravastatin (PRAVACHOL) 20 MG tablet TAKE 1 TABLET BY MOUTH ONCE A DAY  . TRELEGY ELLIPTA 100-62.5-25 MCG/INH AEPB Inhale 1 puff into the lungs daily.    No facility-administered encounter medications on file as of 01/02/2019.     Activities of Daily Living In your present state of health, do you have any difficulty performing the following activities: 01/02/2019  Hearing? N  Vision? N  Difficulty concentrating or making decisions? Y  Comment Note short term memory and difficulty remembering words.  Walking or climbing stairs? N  Dressing or bathing? N  Doing errands, shopping? N  Preparing Food and eating ? N  Using the Toilet? N  In the past six months, have you accidently leaked urine? N  Do you have problems with loss of bowel  control? N  Managing your Medications? N  Managing your Finances? N  Housekeeping or managing your Housekeeping? N  Some recent data might be hidden    Patient Care Team: Allegra GranaArnett, Margaret G, FNP as PCP - General (Family Medicine)   Assessment:   This is a routine wellness examination for Greggory StallionGeorge.   I connected with patient 01/02/19 at 10:30 AM EDT by a video/audio enabled telemedicine application and verified  that I am speaking with the correct person using two identifiers. Patient stated full name and DOB. Patient gave permission to continue with virtual visit. Patient's location was at home and Nurse's location was at [place] office.   Reports changes in short term memory and difficulty remembering words during conversation. Declined in office visit at this time but requests educational information on ways to help encourage brain health. Eating plan for brain health and online luminosity brain stimulation exercises sent to patient.  Agrees to follow up with pcp for possible baseline appointment with neurology if symptoms worsen or new symptoms present. Reports family history of father with symptoms of dementia/alzheimer.    Fasting lab appointment scheduled.   Health Screenings  Colonoscopy - 09/2010 Glaucoma -none Hearing -demonstrates normal hearing during visit. PSA 09/2017 Labs followed by pcp Dental- visits every 4 months Vision- visits within the last 12 months.  Social  Alcohol intake - yes    Smoking history- former  Smokers in home? none Illicit drug use? none Exercise - 3 days, 30 min Diet - regular Sexually Active -never BMI- discussed the importance of a healthy diet, water intake and the benefits of aerobic exercise.  Educational material provided.   Safety  Patient feels safe at home- yes Patient does have smoke detectors at home- yes Patient does wear sunscreen or protective clothing when in direct sunlight -yes Patient does wear seat belt when in a moving  vehicle -yes Patient drive- yes  [id number] precautions and sickness symptoms discussed.   Activities of Daily Living Patient denies needing assistance with: driving, household chores, feeding themselves, getting from bed to chair, getting to the toilet, bathing/showering, dressing, managing money, or preparing meals.  No new identified risk were noted.    Depression Screen Patient denies losing interest in daily life, feeling hopeless, or crying easily over simple problems.   Medication-taking as directed and without issues.   Fall Screen Patient denies being afraid of falling or falling in the last year.   Memory Screen Patient is alert.  Correctly identified the president of the [place], season and states the months of the year in advance.  Patient likes to read and paint for brain stimulation.  Immunizations The following Immunizations were discussed: Influenza, shingles, pneumonia, and tetanus.   Other Providers Patient Care Team: Allegra Grana, FNP as PCP - General (Family Medicine)  Exercise Activities and Dietary recommendations Current Exercise Habits: Home exercise routine, Type of exercise: walking, Time (Minutes): 30, Frequency (Times/Week): 3, Weekly Exercise (Minutes/Week): 90, Intensity: Mild  Goals      Patient Stated   . Increase physical activity (pt-stated)     Resume exercises at the gym  Use recumbent bike twice a week  Cardio exercises        Fall Risk Fall Risk  01/02/2019 12/28/2017 12/20/2016 09/19/2016 05/18/2016  Falls in the past year? 0 No No No No   Depression Screen PHQ 2/9 Scores 01/02/2019 12/28/2017 12/20/2016 09/19/2016  PHQ - 2 Score 0 0 0 0  PHQ- 9 Score - - 0 -    Cognitive Function MMSE - Mini Mental State Exam 12/28/2017 12/20/2016  Orientation to time 5 5  Orientation to Place 5 5  Registration 3 3  Attention/ Calculation 5 5  Recall 2 3  Language- name 2 objects 2 2  Language- repeat 1 1  Language- follow 3 step command 3 3   Language- read & follow direction 1 1  Write a sentence 1 1  Copy  design 1 1  Total score 29 30     6CIT Screen 01/02/2019  What Year? 0 points  What month? 0 points  What time? 0 points  Count back from 20 0 points  Months in reverse 0 points  Repeat phrase 0 points  Total Score 0    Immunization History  Administered Date(s) Administered  . Influenza Split 06/02/2011, 05/17/2014  . Influenza Whole 05/30/2013, 04/26/2018  . Influenza, High Dose Seasonal PF 05/18/2016, 04/17/2017  . Influenza-Unspecified 04/27/2015  . Pneumococcal Conjugate-13 07/31/2014  . Pneumococcal Polysaccharide-23 08/01/2008, 08/31/2012  . Tdap 11/08/2017  . Zoster 07/07/2014   Screening Tests Health Maintenance  Topic Date Due  . INFLUENZA VACCINE  03/02/2019  . TETANUS/TDAP  11/09/2027  . PNA vac Low Risk Adult  Completed      Plan:    End of life planning; Advance aging; Advanced directives discussed.  Copy of current HCPOA/Living Will requested.    I have personally reviewed and noted the following in the patient's chart:   . Medical and social history . Use of alcohol, tobacco or illicit drugs  . Current medications and supplements . Functional ability and status . Nutritional status . Physical activity . Advanced directives . List of other physicians . Hospitalizations, surgeries, and ER visits in previous 12 months . Vitals . Screenings to include cognitive, depression, and falls . Referrals and appointments  In addition, I have reviewed and discussed with patient certain preventive protocols, quality metrics, and best practice recommendations. A written personalized care plan for preventive services as well as general preventive health recommendations were provided to patient.     Ashok Pall, LPN  08/06/1094 Agree with plan. Will offer appointment for memory concerns. Rennie Plowman, NP

## 2019-01-04 ENCOUNTER — Other Ambulatory Visit: Payer: Self-pay

## 2019-01-04 ENCOUNTER — Ambulatory Visit (INDEPENDENT_AMBULATORY_CARE_PROVIDER_SITE_OTHER): Payer: Medicare HMO | Admitting: Family

## 2019-01-04 ENCOUNTER — Encounter: Payer: Self-pay | Admitting: Family

## 2019-01-04 DIAGNOSIS — R413 Other amnesia: Secondary | ICD-10-CM | POA: Diagnosis not present

## 2019-01-04 DIAGNOSIS — R251 Tremor, unspecified: Secondary | ICD-10-CM

## 2019-01-04 DIAGNOSIS — J449 Chronic obstructive pulmonary disease, unspecified: Secondary | ICD-10-CM

## 2019-01-04 NOTE — Assessment & Plan Note (Addendum)
Overall reassured by Mini Mental 24/ 30, when actually 24/24/; NOTE: unable to perform the paper folding, read sheet of paper, write down sentence, or copy picture which is a total of 6 points.  Declines MRI brain today. Based on family history and possible early stage, we agreed on referral to neurology; will follow

## 2019-01-04 NOTE — Assessment & Plan Note (Signed)
Stable at baseline. Following with pulmonology.

## 2019-01-04 NOTE — Patient Instructions (Addendum)
Labs as scheduled  Today we discussed referrals, orders. Pulmonology for sleep apnea, neurology   I have placed these orders in the system for you.  Please be sure to give [place] a call if you have not heard from our office regarding this. We should hear from [place] within ONE week with information regarding your appointment. If not, please let me know immediately.   Let me know of ANY new concerns.    Stay safe

## 2019-01-04 NOTE — Progress Notes (Signed)
This visit type was conducted due to national recommendations for restrictions regarding the COVID-19 pandemic (e.g. social distancing).  This format is felt to be most appropriate for this patient at this time.  All issues noted in this document were discussed and addressed.  No physical exam was performed (except for noted visual exam findings with Video Visits).  Virtual Visit via Video Note  I connected with@  on 01/04/19 at 10:00 AM EDT by a video enabled telemedicine application and verified that I am speaking with the correct person using two identifiers.  Location patient: home Location provider:work or home office Persons participating in the virtual visit: patient, provider  I discussed the limitations of evaluation and management by telemedicine and the availability of in person appointments. The patient expressed understanding and agreed to proceed.   HPI:  Accompanied by wife  Trouble with word finding or using inappropriate word which may be related however not 'exact'. Wife has noticed these symptoms as well.   Hasnt gotten lost, forgot with loved ones. Overall feels safe. No depression.   Occasionally issues with balance, normally with position changes. No falls. No HA, vision changes.   Tremor- in hands.  unchanged, not bothersome.   COPD- compliant with medication. Trelegy has improved symptoms over past year. Cough at baseline. Some episodes of productive cough, tend to be worse at bedtime. Some SOB. No wheezing, fever. Following with Dr Mayo AoFlemming.   Snores.   ROS: See pertinent positives and negatives per HPI.  Past Medical History:  Diagnosis Date  . Bladder outlet obstruction   . BPH (benign prostatic hyperplasia)   . COPD (chronic obstructive pulmonary disease) (HCC)    PULMOLOGIST -- [name].  [facility]  . Elevated PSA   . GERD (gastroesophageal reflux disease)   . History of exercise stress test    06-26-2009  w/ echo--  normal ETT and stress  echo images, no ishemia, ef 65%  . [facility] of [facility]    Apr 2015--  no residual  . Lower urinary tract symptoms (LUTS)   . 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 Urinary hesitancy   . Weak urine stream   . Wears glasses     Past Surgical History:  Procedure Laterality Date  . GREEN LIGHT LASER TURP (TRANSURETHRAL RESECTION OF PROSTATE N/A 07/21/2015   Procedure: GREEN LIGHT LASER TURP (TRANSURETHRAL RESECTION OF PROSTATE;  Surgeon: [name], MD;  [facility];  Service: Urology;  Laterality: N/A;  . TONSILLECTOMY  as child    Family History  Problem Relation Age of Onset  . Cancer Mother        liver  . Memory loss Father   . COPD Sister   . ADD / ADHD Grandchild     SOCIAL HX: former smoker   Current Outpatient Medications:  .  albuterol (PROVENTIL HFA;VENTOLIN HFA) 108 (90 BASE) MCG/ACT inhaler, Inhale 2 puffs into the lungs every 4 (four) hours as needed., Disp: 18 g, Rfl: 3 .  cetirizine (ZYRTEC) 10 MG tablet, Take 10 mg by mouth at bedtime. , Disp: , Rfl:  .  docusate sodium (COLACE) 100 MG capsule, Take 100 mg by mouth every evening., Disp: , Rfl:  .  glucosamine-chondroitin 500-400 MG tablet, Take 1 tablet by mouth 3 (three) times daily., Disp: , Rfl:  .  omeprazole (PRILOSEC) 20 MG capsule, Take 20 mg by mouth as needed., Disp: , Rfl:  .  PARoxetine (PAXIL) 20 MG tablet, TAKE 1 TABLET BY MOUTH ONCE A DAY, Disp: 90 tablet, Rfl: 1 .  pravastatin (PRAVACHOL) 20 MG tablet, TAKE 1 TABLET BY MOUTH ONCE A DAY, Disp: 90 tablet, Rfl: 1 .  TRELEGY ELLIPTA 100-62.5-25 MCG/INH AEPB, Inhale 1 puff into the lungs daily. , Disp: , Rfl:  .  diclofenac sodium (VOLTAREN) 1 % GEL, Apply 4 g topically 4 (four) times daily. (Patient not taking: Reported on 01/04/2019), Disp: 1 Tube, Rfl: 3  EXAM:  VITALS per patient if applicable:  GENERAL: Appropriately dressed. alert, oriented, appears well and in no  acute distress  HEENT: atraumatic, conjunttiva clear, no obvious abnormalities on inspection of external nose and ears  NECK: normal movements of the head and neck  LUNGS: on inspection no signs of respiratory distress, breathing rate appears normal, no obvious gross SOB, gasping or wheezing  CV: no obvious cyanosis  MS: moves all visible extremities without noticeable abnormality  PSYCH/NEURO: pleasant and cooperative, no obvious depression or anxiety, speech and thought processing grossly intact  ASSESSMENT AND PLAN:  Discussed the following assessment and plan:  Memory change - Plan: B12 and Folate Panel, RPR, Ambulatory referral to Pulmonology, Ambulatory referral to Neurology  Chronic obstructive pulmonary disease, unspecified COPD type (HCC)  Tremor Problem List Items Addressed This Visit      Respiratory   COPD (chronic obstructive pulmonary disease) (HCC)    Stable at baseline. Following with pulmonology.         Other   Tremor    Unchanged. Advised to discuss this with neurology as well. Patient verbalized agreement. Will follow      Memory change - Primary    Overall reassured by Mini Mental 24/ 30, when actually 24/24/; NOTE: unable to perform the paper folding, read sheet of paper, write down sentence, or copy picture which is a total of 6 points.  Declines MRI brain today. Based on family history and possible early stage, we agreed on referral to neurology; will follow      Relevant Orders   B12 and Folate Panel   RPR   Ambulatory referral to Pulmonology   Ambulatory referral to Neurology          I discussed the assessment and treatment plan with the patient. The patient was provided an opportunity to ask questions and all were answered. The patient agreed with the plan and demonstrated an understanding of the instructions.   The patient was advised to call back or seek an in-person evaluation if the symptoms worsen or if the condition fails to  improve as anticipated.   Rennie Plowman, FNP

## 2019-01-04 NOTE — Assessment & Plan Note (Signed)
Unchanged. Advised to discuss this with neurology as well. Patient verbalized agreement. Will follow

## 2019-01-07 ENCOUNTER — Other Ambulatory Visit: Payer: Self-pay | Admitting: Family

## 2019-01-07 DIAGNOSIS — I700 Atherosclerosis of aorta: Secondary | ICD-10-CM

## 2019-01-08 ENCOUNTER — Encounter: Payer: Self-pay | Admitting: Family

## 2019-01-09 ENCOUNTER — Institutional Professional Consult (permissible substitution): Payer: Medicare HMO | Admitting: Internal Medicine

## 2019-01-17 ENCOUNTER — Other Ambulatory Visit (INDEPENDENT_AMBULATORY_CARE_PROVIDER_SITE_OTHER): Payer: Medicare HMO

## 2019-01-17 ENCOUNTER — Other Ambulatory Visit: Payer: Self-pay

## 2019-01-17 DIAGNOSIS — H43812 Vitreous degeneration, left eye: Secondary | ICD-10-CM | POA: Diagnosis not present

## 2019-01-17 DIAGNOSIS — J449 Chronic obstructive pulmonary disease, unspecified: Secondary | ICD-10-CM

## 2019-01-17 DIAGNOSIS — R413 Other amnesia: Secondary | ICD-10-CM | POA: Diagnosis not present

## 2019-01-17 DIAGNOSIS — H3562 Retinal hemorrhage, left eye: Secondary | ICD-10-CM | POA: Diagnosis not present

## 2019-01-17 LAB — COMPREHENSIVE METABOLIC PANEL
ALT: 24 U/L (ref 0–53)
AST: 28 U/L (ref 0–37)
Albumin: 4.4 g/dL (ref 3.5–5.2)
Alkaline Phosphatase: 68 U/L (ref 39–117)
BUN: 24 mg/dL — ABNORMAL HIGH (ref 6–23)
CO2: 27 mEq/L (ref 19–32)
Calcium: 10 mg/dL (ref 8.4–10.5)
Chloride: 106 mEq/L (ref 96–112)
Creatinine, Ser: 1 mg/dL (ref 0.40–1.50)
GFR: 72.47 mL/min (ref >60.00)
Glucose, Bld: 104 mg/dL — ABNORMAL HIGH (ref 70–99)
Potassium: 4.4 mEq/L (ref 3.5–5.1)
Sodium: 141 mEq/L (ref 135–145)
Total Bilirubin: 0.8 mg/dL (ref 0.2–1.2)
Total Protein: 7.2 g/dL (ref 6.0–8.3)

## 2019-01-17 LAB — CBC WITH DIFFERENTIAL/PLATELET
Basophils Absolute: 0 10*3/uL (ref 0.0–0.1)
Basophils Relative: 0.6 % (ref 0.0–3.0)
Eosinophils Absolute: 0.1 10*3/uL (ref 0.0–0.7)
Eosinophils Relative: 1.8 % (ref 0.0–5.0)
HCT: 43.7 % (ref 39.0–52.0)
Hemoglobin: 14.9 g/dL (ref 13.0–17.0)
Lymphocytes Relative: 28.8 % (ref 12.0–46.0)
Lymphs Abs: 2.3 10*3/uL (ref 0.7–4.0)
MCHC: 34.2 g/dL (ref 30.0–36.0)
MCV: 93.1 fl (ref 78.0–100.0)
Monocytes Absolute: 0.6 10*3/uL (ref 0.1–1.0)
Monocytes Relative: 7.6 % (ref 3.0–12.0)
Neutro Abs: 4.9 10*3/uL (ref 1.4–7.7)
Neutrophils Relative %: 61.2 % (ref 43.0–77.0)
Platelets: 229 10*3/uL (ref 150.0–400.0)
RBC: 4.7 Mil/uL (ref 4.22–5.81)
RDW: 14 % (ref 11.5–15.5)
WBC: 7.9 10*3/uL (ref 4.0–10.5)

## 2019-01-17 LAB — LIPID PANEL
Cholesterol: 172 mg/dL (ref 0–200)
HDL: 44 mg/dL (ref >39.00)
LDL Cholesterol: 101 mg/dL — ABNORMAL HIGH (ref 0–99)
NonHDL: 127.83
Total CHOL/HDL Ratio: 4
Triglycerides: 133 mg/dL (ref 0.0–149.0)
VLDL: 26.6 mg/dL (ref 0.0–40.0)

## 2019-01-17 LAB — VITAMIN D 25 HYDROXY (VIT D DEFICIENCY, FRACTURES): VITD: 31.96 ng/mL (ref 30.00–100.00)

## 2019-01-17 LAB — PSA: PSA: 1.76 ng/mL (ref 0.10–4.00)

## 2019-01-17 LAB — TSH: TSH: 0.72 u[IU]/mL (ref 0.35–4.50)

## 2019-01-17 LAB — B12 AND FOLATE PANEL
Folate: >23.9 ng/mL (ref >5.9)
Vitamin B-12: 699 pg/mL (ref 211–911)

## 2019-01-17 LAB — HEMOGLOBIN A1C: Hgb A1c MFr Bld: 5.8 % (ref 4.6–6.5)

## 2019-01-18 DIAGNOSIS — H4312 Vitreous hemorrhage, left eye: Secondary | ICD-10-CM | POA: Diagnosis not present

## 2019-01-18 DIAGNOSIS — H35371 Puckering of macula, right eye: Secondary | ICD-10-CM | POA: Diagnosis not present

## 2019-01-18 LAB — RPR: RPR Ser Ql: NONREACTIVE

## 2019-01-21 ENCOUNTER — Other Ambulatory Visit: Payer: Self-pay

## 2019-01-21 MED ORDER — PRAVASTATIN SODIUM 40 MG PO TABS: 40.0000 mg | ORAL_TABLET | Freq: Every day | ORAL | 3 refills | Status: DC

## 2019-02-12 ENCOUNTER — Ambulatory Visit: Payer: Medicare HMO | Admitting: Neurology

## 2019-02-12 ENCOUNTER — Encounter: Payer: Self-pay | Admitting: Neurology

## 2019-02-12 ENCOUNTER — Other Ambulatory Visit: Payer: Self-pay

## 2019-02-12 VITALS — BP 140/82 | HR 75 | Ht 69.5 in | Wt 189.0 lb

## 2019-02-12 DIAGNOSIS — R419 Unspecified symptoms and signs involving cognitive functions and awareness: Secondary | ICD-10-CM | POA: Diagnosis not present

## 2019-02-12 DIAGNOSIS — M47816 Spondylosis without myelopathy or radiculopathy, lumbar region: Secondary | ICD-10-CM | POA: Diagnosis not present

## 2019-02-12 DIAGNOSIS — R4789 Other speech disturbances: Secondary | ICD-10-CM | POA: Diagnosis not present

## 2019-02-12 DIAGNOSIS — M48062 Spinal stenosis, lumbar region with neurogenic claudication: Secondary | ICD-10-CM | POA: Diagnosis not present

## 2019-02-12 DIAGNOSIS — R413 Other amnesia: Secondary | ICD-10-CM

## 2019-02-12 DIAGNOSIS — R030 Elevated blood-pressure reading, without diagnosis of hypertension: Secondary | ICD-10-CM | POA: Diagnosis not present

## 2019-02-12 DIAGNOSIS — Z6827 Body mass index (BMI) 27.0-27.9, adult: Secondary | ICD-10-CM | POA: Diagnosis not present

## 2019-02-12 DIAGNOSIS — M4316 Spondylolisthesis, lumbar region: Secondary | ICD-10-CM | POA: Diagnosis not present

## 2019-02-12 DIAGNOSIS — M545 Low back pain: Secondary | ICD-10-CM | POA: Diagnosis not present

## 2019-02-12 DIAGNOSIS — M5136 Other intervertebral disc degeneration, lumbar region: Secondary | ICD-10-CM | POA: Diagnosis not present

## 2019-02-12 NOTE — Patient Instructions (Signed)
You have complaints of memory loss: memory loss or changes in cognitive function can have many reasons and does not always mean you have dementia. Conditions that can contribute to subjective or objective memory loss include: depression, stress, poor sleep from insomnia or sleep apnea, dehydration, fluctuation in blood sugar values, thyroid or electrolyte dysfunction and certain vitamin deficiencies. Dementia can be caused by stroke, brain atherosclerosis or brain vascular disease due to vascular risk factors (smoking, high blood pressure, high cholesterol, obesity and uncontrolled diabetes), certain degenerative brain disorders (including Parkinson's disease and Multiple sclerosis) and by Alzheimer's disease or other, more rare and sometimes hereditary causes. We will do some additional testing: blood work (which has been done recently already with Mable Paris, NP) and we will do a brain scan. We will not start medication as yet. We will also request a formal cognitive test called neuropsychological evaluation which is done by a licensed neuropsychologist. We will make a referral in that regard. We will call you with brain scan test results and monitor your symptoms.  Evaluation and potential treatment for obstructive sleep apnea is also part of work-up, please keep [place] posted in that regard.

## 2019-02-12 NOTE — Progress Notes (Signed)
Subjective:    Patient ID: Joseph DAI Sr. is a 78 y.o. male.  HPI     [name], MD, PhD [name] [address] [place], [place] [address]  Dear [name], I saw your patient, Joseph Benson, upon your kind request in my neurologic clinic today for initial consultation of his memory loss.  The patient is accompanied by his wife today.  As you know, [name] is a 78 year old right-handed gentleman with an underlying medical history of post polio, HLP, history of [place] spotted fever, reflux disease, COPD, BPH, and overweight state, who reports memory issues over the past year.  His wife reports a gradual decline in his memory function over the past 2+ years, more noticeable to him in the past year.  His main difficulty is word finding and quick recall, has some forgetfulness, short-term issues, losing train of thought, forgetting why he went to another room etc.  He has never been very good with names to begin with.  His mother died at age 36 from liver cancer, father lived to be 69 years old and had memory issues in the last 5 years of his life, particularly after losing his wife.  His father had lapses in memory, had to give up driving as he had not just gotten lost driving but left the state on 3 occasions, ended up in [place] and one time in [place].  Patient reports no difficulty with his driving.  He had 1 sister who was 45 years older and smoked, she passed away.  He does not recall any dementia in his grandparents.  His wife has noticed that he has become more frustrated with his word finding issues.  He reports that he has been getting together with a group of friends, sometimes as many as over 10 people, they have been going to [employer] for the past 18 years on a regular basis and he has noticed that sometimes during a conversation he loses his train of thought.  The group consists of varying age groups, from 77 years old to 77 year old,  he has obviously seen some similar issues in some of his friends.  He denies any significant anxiety or depression.  He has a history of polio which affected his left leg.  He has a history of sciatica which affects his lower back and radiates to the left.  He used to see [name] who retired, he now sees [name].  He has received injections into his lower back successfully. He has 2 grown children, is retired, quit smoking in February 2001, drinks alcohol in the form of beer, one with supper daily.  He drinks caffeine in the form of coffee, 1 to 3 cups/day on average. I reviewed your video visit note from 01/04/2019 and he had recent blood work through your office on 01/17/2019 which I reviewed.  A1c was 5.8, RPR nonreactive, B12 699, vitamin D borderline at 31.96, PSA normal, TSH normal, LDL 101, total cholesterol 172, lipid panel improved compared to last year.  He was advised to increase his Pravachol to 40 mg daily for further improvement of his lipid panel.  He is being evaluated for sleep apnea through pulmonology, per wife, he has a home sleep test pending for 03/12/2019.  His pulmonologist is located in [place].  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  He has never been good with names.   His Past Medical History Is Significant For: Past Medical History:  Diagnosis Date  . Bladder outlet obstruction   . BPH (benign prostatic hyperplasia)   . COPD (chronic obstructive pulmonary disease) (HCC)    PULMOLOGIST -- [name].  [facility]  . Elevated PSA   . GERD (gastroesophageal reflux disease)   . History of exercise stress test    06-26-2009  w/ echo--  normal ETT and stress echo images, no ishemia, ef 65%  . History of [facility]    Apr 2015--  no residual  . Lower urinary tract symptoms (LUTS)   . 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 Urinary hesitancy   . Weak urine stream   . Wears glasses     His Past Surgical History Is Significant For: Past Surgical History:  Procedure Laterality Date  . GREEN LIGHT LASER TURP (TRANSURETHRAL RESECTION OF PROSTATE N/A 07/21/2015   Procedure: GREEN LIGHT LASER TURP (TRANSURETHRAL RESECTION OF PROSTATE;  Surgeon: [name], MD;  [facility];  Service: Urology;  Laterality: N/A;  . TONSILLECTOMY  as child    His Family History Is Significant For: Family History  Problem Relation Age of Onset  . Cancer Mother        liver  . Memory loss Father   . COPD Sister   . ADD / ADHD Grandchild     His Social History Is Significant For: Social History   Socioeconomic History  . Marital status: Married    Spouse name: Not on file  . Number of children: Not on file  . Years of education: Not on file  . Highest education level: Not on file  Occupational History  . Not on file  Social Needs  . Financial resource strain: Not hard at all  . Food insecurity    Worry: Never true    Inability: Never true  . Transportation needs    Medical: No    Non-medical: No  Tobacco Use  . Smoking status: Former Smoker    Years: 30.00    Types: Cigarettes    Quit date: 04/26/2000    Years since quitting: 18.8  . Smokeless tobacco: Never Used  Substance and Sexual Activity  . Alcohol use: Yes    Alcohol/week: 1.0 standard drinks    Types: 1 Cans of beer per week    Comment: occasional  . Drug use: No  . Sexual activity: Never  Lifestyle  . Physical activity    Days per week: 3 days    Minutes per session: 30 min  . Stress: Not at all  Relationships  . Social Musicianconnections    Talks on phone: Not on file    Gets together: Not on file    Attends religious service: Not on file    Active member  of club or organization: Not on file    Attends meetings of clubs or organizations: Not on file    Relationship status: Not on file  Other Topics Concern  . 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    Paints for fun   Wife is patient  His Allergies Are:  Allergies  Allergen Reactions  . Spiriva [Tiotropium Bromide Monohydrate]     Worsening lung function  :   His Current Medications Are:  Outpatient Encounter Medications as of 02/12/2019  Medication Sig  . albuterol (PROVENTIL HFA;VENTOLIN HFA) 108 (90 BASE) MCG/ACT inhaler Inhale 2 puffs into the lungs every 4 (four) hours as needed.  . cetirizine (ZYRTEC) 10 MG tablet Take 10 mg by mouth at bedtime.   . docusate sodium (COLACE) 100 MG capsule Take 100 mg by mouth every evening.  Marland Kitchen. glucosamine-chondroitin 500-400 MG tablet Take 1 tablet by mouth 3 (three) times daily.  Marland Kitchen. omeprazole (PRILOSEC) 20 MG capsule Take 20 mg by mouth as needed.  Marland Kitchen. PARoxetine (PAXIL) 20 MG tablet TAKE 1 TABLET BY MOUTH ONCE A DAY  . pravastatin (PRAVACHOL) 40 MG tablet Take 1 tablet (40 mg total) by mouth daily.  . TRELEGY ELLIPTA 100-62.5-25 MCG/INH AEPB Inhale 1 puff into the lungs daily.   . [DISCONTINUED] diclofenac sodium (VOLTAREN) 1 % GEL Apply 4 g topically 4 (four) times daily. (Patient not taking: Reported on 01/04/2019)   No facility-administered encounter medications on file as of 02/12/2019.   :  Review of Systems:  Out of a complete 14 point review of systems, all are reviewed and negative with the exception of these symptoms as listed be Review of Systems  Neurological:       Pt presents today to discuss his memory. Pt has trouble recalling words. He thinks his memory has worsened over the past year.    Objective:  Neurological Exam  Physical Exam Physical Examination:   Vitals:   02/12/19 0946  BP: 140/82  Pulse: 75    General Examination: The  patient is a very pleasant 78 y.o. male in no acute distress. He appears well-developed and well-nourished and well groomed.   HEENT: Normocephalic, atraumatic, pupils are equal, round and reactive to light and accommodation. Extraocular tracking is good without limitation to gaze excursion or nystagmus noted. Normal smooth pursuit is noted. Hearing is grossly intact. Face is symmetric with normal facial animation and normal facial sensation. Speech is clear with no dysarthria noted. There is no hypophonia. There is no lip, neck/head, jaw or voice tremor. Neck is supple with full range of passive and active motion. There are no carotid bruits on auscultation. Oropharynx exam reveals: mild mouth dryness, adequate dental hygiene and moderate airway crowding. Tongue protrudes centrally and palate elevates symmetrically.   Chest: Clear to auscultation without wheezing, rhonchi or crackles noted.  Heart: S1+S2+0, regular and normal without murmurs, rubs or gallops noted.   Abdomen: Soft, non-tender and non-distended with normal bowel sounds appreciated on auscultation.  Extremities: There is no pitting edema in the distal lower extremities bilaterally. Pedal pulses are intact.  Skin: Warm and dry without trophic changes noted.  Musculoskeletal: exam reveals no obvious joint deformities, tenderness or joint swelling or erythema.   Neurologically:  Mental status: The patient is awake, alert and oriented in all 4 spheres. His immediate and remote memory, attention, language skills and fund of knowledge are fairly appropriate. There is no evidence of aphasia, agnosia, apraxia or anomia. Speech is clear with normal prosody and enunciation. Thought process is linear. Mood is normal and affect is normal.  He is able to provide a fairly detailed and concise history.  His wife adds details in her observations. On 02/12/2019: MMSE: 21/30 (He lost 2 points on the orientation, 4 points on serial sevens, 3 on remote  recall). Clock drawing (CDT): 4/4, AFT: 14/min.  Cranial nerves II - XII are as described above under HEENT exam. In addition: shoulder shrug is normal with equal shoulder height noted. Motor exam: Normal bulk, strength and tone is noted. There is no drift, tremor or rebound. Romberg is negative. Reflexes are 2+ throughout. Babinski: Toes are flexor bilaterally. Fine motor skills and coordination: intact with normal finger taps, normal hand movements, normal rapid alternating patting, normal foot taps and normal foot agility.  Cerebellar testing: No dysmetria or intention tremor on finger to nose testing. Heel to shin is unremarkable bilaterally. There is no truncal or gait ataxia.  Sensory exam: intact to light touch in the upper and lower extremities.  Gait, station and balance: He stands easily But does report lower back pain, he walks slightly wide-based and cautiously, slight catch with the left leg, reports left-sided radiating back pain  Assessment and Plan:   In summary, Michaela CornerGeorge R Plantz Sr. is a very pleasant 78 y.o.-year old male with an underlying medical history of post polio, HLP, [facility], reflux disease, COPD, BPH, and overweight state, who Presents for evaluation of his memory loss least 2 years duration, more noticeable in the past year.  His history suggests no significant difficulty, MMSE stands a little in contrast with the ability to relate a very detailed and concise history.  He does not give a telltale family history of dementia/Alzheimer's disease although his father did have memory loss in the last 5 years, passed away at 885, 5 years after losing his wife of over 65 years.  The patient is being evaluated for sleep apnea, he had recent Appropriate blood work.  I would recommend pursuing a brain MRI, and more formal evaluation of his memory with a referral to a neuropsychologist. I had a long chat with the patient and his wife about my findings and the  diagnosis of memory loss and dementia, its prognosis and treatment options. We talked about medical treatments and non-pharmacological approaches. We talked about ongoing smoking cessation and about maintaining a healthy lifestyle in general and staying active mentally and physically. I encouraged the patient to eat healthy, exercise daily and keep well hydrated, to keep a scheduled bedtime and wake time routine, to not skip any meals and eat healthy snacks in between meals and to have protein with every meal. I stressed the importance of regular exercise, within of course the patient's own mobility limitations. I encouraged the patient to keep up with current events by reading the news paper or watching the news and to do word puzzles, or if feasible, to go on [email or web address].   As far as further diagnostic testing is concerned, I suggested the following: MRI brain, neuropsychological evaluation and pursuing sleep evaluation which he is doing through his pulmonologist. He likes to paint, particularly water color.  He is very talented from what I can see on the pictures they shared on the phone with me.  He also has a website.  He is advised to continue to stay active and we will reconvene after sleep study evaluation, neuropsych evaluation and MRI are done.  I would not recommend any new medications quite yet.  I answered all their questions today and the patient and his wife were in agreement with the above outlined plan. I would like to see the patient back in 4 months, sooner if needed.  Thank you very much for allowing me to participate in the care of this nice  patient. If I can be of any further assistance to you please do not hesitate to call me at [phone].  Sincerely,   [name], MD, PhD

## 2019-02-13 ENCOUNTER — Telehealth: Payer: Self-pay | Admitting: Neurology

## 2019-02-13 NOTE — Telephone Encounter (Signed)
[employer] Josem Kaufmann: [id number] (exp. 02/13/19 to 08/12/19) patient is scheduled at [facility] for 02/23/19 arrival time is 11:30 AM. I left a voicemail informing the patient this information. I also left their number of [phone] incase he needed to r/s for any reason.

## 2019-02-14 DIAGNOSIS — M48061 Spinal stenosis, lumbar region without neurogenic claudication: Secondary | ICD-10-CM | POA: Diagnosis not present

## 2019-02-14 DIAGNOSIS — H4312 Vitreous hemorrhage, left eye: Secondary | ICD-10-CM | POA: Diagnosis not present

## 2019-02-14 DIAGNOSIS — M5116 Intervertebral disc disorders with radiculopathy, lumbar region: Secondary | ICD-10-CM | POA: Diagnosis not present

## 2019-02-14 DIAGNOSIS — M4726 Other spondylosis with radiculopathy, lumbar region: Secondary | ICD-10-CM | POA: Diagnosis not present

## 2019-02-21 ENCOUNTER — Encounter: Payer: Self-pay | Admitting: Psychology

## 2019-02-23 ENCOUNTER — Ambulatory Visit
Admission: RE | Admit: 2019-02-23 | Discharge: 2019-02-23 | Disposition: A | Discharge status: Home / Self Care | Payer: Medicare HMO | Source: Ambulatory Visit | Attending: Neurology | Admitting: Neurology

## 2019-02-23 ENCOUNTER — Other Ambulatory Visit: Payer: Self-pay

## 2019-02-23 DIAGNOSIS — R4789 Other speech disturbances: Secondary | ICD-10-CM | POA: Diagnosis not present

## 2019-02-23 DIAGNOSIS — R413 Other amnesia: Secondary | ICD-10-CM | POA: Diagnosis not present

## 2019-02-23 DIAGNOSIS — R419 Unspecified symptoms and signs involving cognitive functions and awareness: Secondary | ICD-10-CM | POA: Diagnosis not present

## 2019-02-23 DIAGNOSIS — R42 Dizziness and giddiness: Secondary | ICD-10-CM | POA: Diagnosis not present

## 2019-02-25 ENCOUNTER — Telehealth: Payer: Self-pay

## 2019-02-25 NOTE — Telephone Encounter (Signed)
I called pt to discuss his MRI results. No answer, left a message asking him to call me back. 

## 2019-02-25 NOTE — Progress Notes (Signed)
Brain MRI wo contrast showed no acute findings; age appropriate changes were seen. Please update patient.  Michel Bickers

## 2019-02-25 NOTE — Telephone Encounter (Signed)
-----   Message from [name], MD sent at 02/25/2019  8:15 AM EDT ----- Brain MRI wo contrast showed no acute findings; age appropriate changes were seen. Please update patient.  Joseph Benson

## 2019-02-26 ENCOUNTER — Encounter: Payer: Self-pay | Admitting: Neurology

## 2019-02-26 NOTE — Telephone Encounter (Signed)
Pt responded via my chart

## 2019-02-27 ENCOUNTER — Telehealth: Payer: Self-pay | Admitting: *Deleted

## 2019-02-27 DIAGNOSIS — E785 Hyperlipidemia, unspecified: Secondary | ICD-10-CM

## 2019-02-27 NOTE — Telephone Encounter (Signed)
Please place future orders for lab appt.  

## 2019-03-01 ENCOUNTER — Other Ambulatory Visit (INDEPENDENT_AMBULATORY_CARE_PROVIDER_SITE_OTHER): Payer: Medicare HMO

## 2019-03-01 ENCOUNTER — Other Ambulatory Visit: Payer: Self-pay

## 2019-03-01 DIAGNOSIS — E785 Hyperlipidemia, unspecified: Secondary | ICD-10-CM | POA: Diagnosis not present

## 2019-03-01 LAB — COMPREHENSIVE METABOLIC PANEL
ALT: 28 U/L (ref 0–53)
AST: 22 U/L (ref 0–37)
Albumin: 4 g/dL (ref 3.5–5.2)
Alkaline Phosphatase: 61 U/L (ref 39–117)
BUN: 30 mg/dL — ABNORMAL HIGH (ref 6–23)
CO2: 26 mEq/L (ref 19–32)
Calcium: 9.5 mg/dL (ref 8.4–10.5)
Chloride: 107 mEq/L (ref 96–112)
Creatinine, Ser: 0.98 mg/dL (ref 0.40–1.50)
GFR: 74.15 mL/min (ref >60.00)
Glucose, Bld: 140 mg/dL — ABNORMAL HIGH (ref 70–99)
Potassium: 4.5 mEq/L (ref 3.5–5.1)
Sodium: 141 mEq/L (ref 135–145)
Total Bilirubin: 1 mg/dL (ref 0.2–1.2)
Total Protein: 6.8 g/dL (ref 6.0–8.3)

## 2019-03-04 DIAGNOSIS — R030 Elevated blood-pressure reading, without diagnosis of hypertension: Secondary | ICD-10-CM | POA: Diagnosis not present

## 2019-03-04 DIAGNOSIS — M5136 Other intervertebral disc degeneration, lumbar region: Secondary | ICD-10-CM | POA: Diagnosis not present

## 2019-03-04 DIAGNOSIS — M47816 Spondylosis without myelopathy or radiculopathy, lumbar region: Secondary | ICD-10-CM | POA: Diagnosis not present

## 2019-03-04 DIAGNOSIS — Z6826 Body mass index (BMI) 26.0-26.9, adult: Secondary | ICD-10-CM | POA: Diagnosis not present

## 2019-03-04 DIAGNOSIS — M4316 Spondylolisthesis, lumbar region: Secondary | ICD-10-CM | POA: Diagnosis not present

## 2019-03-04 DIAGNOSIS — M48062 Spinal stenosis, lumbar region with neurogenic claudication: Secondary | ICD-10-CM | POA: Diagnosis not present

## 2019-03-12 DIAGNOSIS — G4710 Hypersomnia, unspecified: Secondary | ICD-10-CM | POA: Diagnosis not present

## 2019-03-12 DIAGNOSIS — J439 Emphysema, unspecified: Secondary | ICD-10-CM | POA: Diagnosis not present

## 2019-03-14 ENCOUNTER — Encounter: Payer: Self-pay | Admitting: Neurology

## 2019-03-14 DIAGNOSIS — H33312 Horseshoe tear of retina without detachment, left eye: Secondary | ICD-10-CM | POA: Diagnosis not present

## 2019-03-14 DIAGNOSIS — H4312 Vitreous hemorrhage, left eye: Secondary | ICD-10-CM | POA: Diagnosis not present

## 2019-03-16 DIAGNOSIS — G4733 Obstructive sleep apnea (adult) (pediatric): Secondary | ICD-10-CM | POA: Diagnosis not present

## 2019-04-04 ENCOUNTER — Encounter: Payer: Self-pay | Admitting: Family

## 2019-04-04 DIAGNOSIS — R69 Illness, unspecified: Secondary | ICD-10-CM | POA: Diagnosis not present

## 2019-04-04 DIAGNOSIS — G4733 Obstructive sleep apnea (adult) (pediatric): Secondary | ICD-10-CM | POA: Insufficient documentation

## 2019-04-05 ENCOUNTER — Telehealth: Payer: Self-pay | Admitting: Family

## 2019-04-05 NOTE — Telephone Encounter (Signed)
Please fax sleep study report to [facility] Dr Virgina Norfolk.   Dr Cathlyn Parsons,  [facility] you are well. I wanted to share these OSA results with you for our mutual patient.  It doesn't appear that he requires a cipap for his mild OSA. Certainly let me know if you feel otherwise.  My best, [name], NP

## 2019-04-05 NOTE — Telephone Encounter (Signed)
I have printed letter to Dr.Samar & have faxed to their office.

## 2019-04-10 ENCOUNTER — Encounter: Payer: Self-pay | Admitting: Family

## 2019-04-12 ENCOUNTER — Other Ambulatory Visit: Payer: Self-pay | Admitting: Family

## 2019-05-23 ENCOUNTER — Ambulatory Visit: Payer: Medicare HMO | Admitting: Psychology

## 2019-06-03 ENCOUNTER — Encounter: Payer: Medicare HMO | Attending: Psychology | Admitting: Psychology

## 2019-06-03 ENCOUNTER — Other Ambulatory Visit: Payer: Self-pay

## 2019-06-03 DIAGNOSIS — F0280 Dementia in other diseases classified elsewhere without behavioral disturbance: Secondary | ICD-10-CM | POA: Diagnosis present

## 2019-06-03 DIAGNOSIS — G3109 Other frontotemporal dementia: Secondary | ICD-10-CM | POA: Insufficient documentation

## 2019-06-03 DIAGNOSIS — R4789 Other speech disturbances: Secondary | ICD-10-CM | POA: Insufficient documentation

## 2019-06-03 DIAGNOSIS — R419 Unspecified symptoms and signs involving cognitive functions and awareness: Secondary | ICD-10-CM | POA: Diagnosis not present

## 2019-06-03 DIAGNOSIS — R413 Other amnesia: Secondary | ICD-10-CM | POA: Diagnosis not present

## 2019-06-03 NOTE — Progress Notes (Signed)
Neuropsychological Consultation   Patient:   Joseph MASSMANN Sr.   DOB:   10/15/1940  MR Number:  [id number]  Location:  [facility] FOR PAIN AND [facility] MEDICINE [facility] PHYSICAL MEDICINE AND REHABILITATION [address] [place], [address] [id number] [facility] [place] [address] Dept: [phone]           Date of Service:   06/03/2019  Start Time:   1 PM End Time:   3 PM  Provider/Observer:  Arley Phenix, Psy.D.       Clinical Neuropsychologist       Billing Code/Service: 96116/96121  Today's visit was 2 hours in duration.  The first hour was spent in a face-to-face in person clinical interview with the patient, his wife and myself present.  It was conducted in my outpatient clinic office.  The second hour was utilized with records review and report writing/interpretation.  Chief Complaint:    Joseph Benson. Joseph Mcclune. is a 78 year old male referred by Dr. Frances Furbish, with [facility], for neuropsychological evaluation.  The patient describes a history of memory loss/memory changes that have been present to some degree over the past 10 years and it been gradually progressing.  However, the patient is wife report that over the last 6 to 9 months that these memory difficulties have been more problematic and apparent.  The patient also describes word finding issues and circumlocutions but no paraphasic errors.  The patient denies any geographic disorientation but does utilize GPS at night.  The patient denies any motor changes or motor difficulties.  Reason for Service:  Joseph Guastella. Joseph [place]merville. is a 78 year old male referred by Dr. Frances Furbish, with [facility], for neuropsychological evaluation.  The patient describes a history of memory loss/memory changes that have been present to some degree over the past 10 years and it been gradually progressing.  However, the patient is wife report that over the last 6 to 9 months that these memory difficulties have  been more problematic and apparent.  The patient also describes word finding issues and circumlocutions but no paraphasic errors.  The patient denies any geographic disorientation but does utilize GPS at night.  The patient denies any motor changes or motor difficulties.  The patient has a medical history that includes history of post polio disease, hyperkeratosis, [facility], reflux disease, COPD, BPH, and overweight state.  The patient and his wife both report that there has been a gradual decline in memory issues over the past 2+ years and the patient's wife reports that she started noticing symptoms as much as 10 years ago.  They both report that these have been more noticeable over the past year.  The patient is also described as having word finding issues.  The patient reports that he can be in conversations and can find the words that he wants to say.  He will ultimately use circumlocutions of fine replacement words and both the patient and his wife deny any paraphasic errors.  The patient also describes short-term memory issues including losing his train involved over forgetting why he went into of this particular room.  The patient's wife reports that the short-term memory issues that she noticed is that he appears to hear stuff that she is saying but that does not remember what others have said afterwards in many situations.  Most of these issues have to do with short-term memory recall and the patient reports that sometimes cueing helps with his memory.  The patient's  mother died when she was 29 years old of liver cancer.  The patient reports that his father died at 61 years of age.  He reports that towards the end of his life that he started having memory issues and significant geographic disorientation that led to having his driver's license taken away.  The father is reported to have lapses of memory and significant occasions of getting lost while driving on at least 3  separate occasions where he actually left the state and ended up in another state.  The patient reports that he has been very frustrated with his poor recall of memory issues and especially with issues with his vocabulary and word finding issues.  The patient describes some issues with postural hypotension but denies any history of loss of consciousness or concussive events/MVAs.  The patient denies ever being exposed to solvents on any regular basis.  He does report some mild tremors in his hand but they are not constant.  The patient reports that his sleep is quite good and that he can sleep anywhere at any time and this is always been true.  The patient was evaluated for sleep apnea and reports that he was not found to have sleep apnea but does have significant COPD.  The patient reports that he will sleep in a recliner at night because of his snoring and breathing issues.  The patient had an MRI conducted on 02/23/2019.  The MRI showed normal aging brain as far as his impressions.  There were no indications of acute infarct, acute hemorrhage or extra axial collection.  There were some mild symptoms of white matter hyperintensity that was felt to be due to chronic ischemic microangiopathy but was not unexpected for age.  Current Status:  The patient describes short-term memory issues and retrieval/recall difficulties, word finding difficulties.  Reliability of Information: The information is derived from 1 hour face-to-face clinical interview as well as review of available medical records.  Behavioral Observation: Joseph CECCHI Sr.  presents as a 78 y.o.-year-old Right Caucasian Male who appeared his stated age. his dress was Appropriate and he was Well Groomed and his manners were Appropriate to the situation.  his participation was indicative of Appropriate and Attentive behaviors.  There were not any physical disabilities noted.  he displayed an appropriate level of cooperation and motivation.      Interactions:    Active Appropriate and Attentive  Attention:   within normal limits and attention span and concentration were age appropriate  Memory:   abnormal; remote memory intact, recent memory impaired  Visuo-spatial:  not examined  Speech (Volume):  normal  Speech:   normal; normal  Thought Process:  Coherent and Relevant  Though Content:  WNL; not suicidal and not homicidal  Orientation:   person, place, time/date and situation  Judgment:   Good  Planning:   Good  Affect:    Appropriate  Mood:    Dysphoric  Insight:   Good  Intelligence:   very high  Marital Status/Living: The patient was born and raised in [place] with 1 sibling.  He denied any significant childhood illness.  The patient was roughly 4 pounds at birth.  He was very low birth rate but unsure about whether he was premature or not.  Major childhood illnesses include chickenpox, polio (1959) the patient is married and lives with his wife Joseph Benson along with her dog.  The patient was married to his current wife in 85.  He was married 2  previous times.  His first marriage was [phone] and his second marriage was [phone].  The patient is a 78 year old son.  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  Other hobbies and interests include reading, yard work and travel.  Past Employment:  The patient worked with the [employer] program as well as [employer] until retirement.  Substance Use:  No concerns of substance abuse are reported.  The patient reports that he will have 1 beer with his supper about 5 times per week but not every night.  He quit smoking in 2001 but does have COPD.  Education:   The patient received his doctorate of education from [employer] in [place].  He received his masters of education from [employer] [address]  [place]  and undergraduate school from [employer].  He also attended [employer] through high school.  Medical History:   Past Medical History:  Diagnosis Date  . Bladder outlet obstruction   . BPH (benign prostatic hyperplasia)   . COPD (chronic obstructive pulmonary disease) (HCC)    PULMOLOGIST -- [name].  [facility]  . Elevated PSA   . GERD (gastroesophageal reflux disease)   . History of exercise stress test    06-26-2009  w/ echo--  normal ETT and stress echo images, no ishemia, ef 65%  . History of [facility] fever    Apr 2015--  no residual  . Lower urinary tract symptoms (LUTS)   . 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 Urinary hesitancy   . Weak urine stream   . Wears glasses            Abuse/Trauma History: The patient denies any significant abuse or trauma during his life but did have polio with a four 5-day period of time with high fever and then 3 months of recovery in 1959.  Psychiatric History:  The patient denies any significant psychiatric history.  Family Med/Psych History:  Family History  Problem Relation Age of Onset  . Cancer Mother        liver  . Memory loss Father   . COPD Sister   . ADD / ADHD Grandchild     Impression/DX:  Joseph FruitGeorge R. Monico HoarLentz Sr. is a 78 year old male referred by Dr. Frances FurbishAthar, with [facility], for neuropsychological evaluation.  The patient describes a history of memory loss/memory changes that have been present to some degree over the past 10 years and it been gradually progressing.  However, the patient is wife report that over the last 6 to 9 months that these memory difficulties have been more problematic and apparent.  The patient also describes word finding issues and circumlocutions but no paraphasic errors.  The patient denies any geographic disorientation but does utilize GPS at night.  The patient denies any motor changes or motor difficulties.  The patient  describes short-term memory issues and retrieval/recall difficulties, word finding difficulties.    Disposition/Plan:  We have set the patient for formal neuropsychological testing.  Will administer the Wechsler Adult Intelligence Scale-IV as well as the Wechsler Control and instrumentation engineerMemory Scale-for.  We will also do formal expressive language testing utilizing word fluency measures and targeted naming measures.  Diagnosis:    Memory loss  Word finding problem  Cognitive complaints         Electronically Signed  _______________________ Ilean Skill, Psy.D.

## 2019-06-04 ENCOUNTER — Encounter: Payer: Self-pay | Admitting: Neurology

## 2019-06-04 DIAGNOSIS — R0600 Dyspnea, unspecified: Secondary | ICD-10-CM | POA: Diagnosis not present

## 2019-06-04 DIAGNOSIS — J449 Chronic obstructive pulmonary disease, unspecified: Secondary | ICD-10-CM | POA: Diagnosis not present

## 2019-06-07 ENCOUNTER — Encounter: Payer: Medicare HMO | Admitting: Psychology

## 2019-06-07 ENCOUNTER — Encounter: Payer: Self-pay | Admitting: Psychology

## 2019-06-07 ENCOUNTER — Other Ambulatory Visit: Payer: Self-pay

## 2019-06-07 DIAGNOSIS — R413 Other amnesia: Secondary | ICD-10-CM

## 2019-06-07 DIAGNOSIS — F0280 Dementia in other diseases classified elsewhere without behavioral disturbance: Secondary | ICD-10-CM | POA: Diagnosis not present

## 2019-06-07 DIAGNOSIS — R4789 Other speech disturbances: Secondary | ICD-10-CM | POA: Diagnosis not present

## 2019-06-07 DIAGNOSIS — R419 Unspecified symptoms and signs involving cognitive functions and awareness: Secondary | ICD-10-CM | POA: Diagnosis not present

## 2019-06-07 DIAGNOSIS — G3109 Other frontotemporal dementia: Secondary | ICD-10-CM | POA: Diagnosis not present

## 2019-06-07 NOTE — Progress Notes (Signed)
The patient arrived on time to his 13:00 testing appointment, which lasted 240 minutes.  Behavioral Observations:  Appearance: Casually and appropriately dressed with good hygiene. Gait: Ambulated independently without assistance Speech: Clear, normal rate, tone & volume. Mild-Moderate WFD noted Thought process:  Logical & mostly organized. Unremarkable Mood/Affect: Mildly anxious and depressed, appropriate  Interpersonal: Polite and appropriate. Orientation: Oriented x 4 Effort/Motivation: Good   He did not appear to have difficulty seeing, hearing, or understanding test items but required questions (e.g. word problems) to be repeated often. He exhibited below average frustration tolerance on questions he did not know or tasks that were more difficult (e.g. learning and memory items on WMS-IV-OA) He was cooperative with all assigned tasks and persisted well overall (e.g. did not require many breaks).   Tests Administered: . Animal Naming . [employer] (BNT) . Clock Drawing Test . Controlled Oral Word Association Test (COWAT) . Trail Making Test (TMT A & B) . Wechsler Adult Intelligence Scale, 4th Edition (WAIS-IV) . Wechsler Memory Scale, 4th edition, Older Adult Battery (WMS-IV-OA)  Results  Animal Naming Test . Total=11, T=29, 2nd % o Repetition=1 o Intrusion=1 [employer]  . Total=52/60, T=41, 18th % Clock Drawing Test . WNL COWAT . FAS Total=34, T=43, 25th % o Repetition=2 o Intrusion=1 Trail Making Test . Part A  o Time=17.69", T=74, 99th % o Errors=0 . Part B o Time=144.28, T=38, 12th % o Errors=2   WAIS-IV Composite Score Summary  Scale Sum of Scaled Scores Composite Score Percentile Rank 95% Conf. Interval Qualitative Description  Verbal Comprehension 37 VCI 112 79 106-117 High Average  Perceptual Reasoning 38 PRI 115 84 108-120 High Average  Working Memory 18 WMI 95 37 89-102 Average  Processing Speed 32 PSI 132 98 120-137 Very Superior   Full Scale 125 FSIQ 117 87 113-121 High Average  General Ability 75 GAI 115 84 110-119 High Average   Index Level Discrepancy Comparisons  Comparison Score 1 Score 2 Difference Critical Value .05 Significant Difference Y/N Base Rate by Overall Sample  VCI - PRI 112 115 -3 9.29 N 43.4  VCI - WMI 112 95 17 8.81 Y 9.4  VCI - PSI 112 132 -20 10.18 Y 10.2  PRI - WMI 115 95 20 9.74 Y 7.1  PRI - PSI 115 132 -17 10.99 Y 12.9  WMI - PSI 95 132 -37 10.59 Y 0.9  FSIQ - GAI 117 115 2 3.34 N 38.4   Differences Between Subtest and Overall Mean of Subtest Scores  Subtest Subtest Scaled Score Mean Scaled Score Difference Critical Value .05 Strength or Weakness Base Rate  Block Design 12 12.50 -0.50 2.85  >25%  Similarities 14 12.50 1.50 2.82  >25%  Digit Span 10 12.50 -2.50 2.22 W 15-25%  Matrix Reasoning 13 12.50 0.50 2.54  >25%  Vocabulary 11 12.50 -1.50 2.03  >25%  Arithmetic 8 12.50 -4.50 2.73 W 2-5%  Symbol Search 14 12.50 1.50 3.42  >25%  Visual Puzzles 13 12.50 0.50 2.71  >25%  Information 12 12.50 -0.50 2.19  >25%  Coding 18 12.50 5.50 2.97 S 2%    WMS-IV (Older Adult Battery)  Index Score Summary  Index Sum of Scaled Scores Index Score Percentile Rank 95% Confidence Interval Qualitative Descriptor  Auditory Memory (AMI) 26 80 9 75-87 Low Average  Visual Memory (VMI) 18 95 37 90-100 Average  Immediate Memory (IMI) 21 81 10 76-88 Low Average  Delayed Memory (DMI) 23 85 16 79-94 Low Average  Primary Subtest Scaled Score Summary  Subtest Domain Raw Score Scaled Score Percentile Rank  Logical Memory I AM 16 5 5   Logical Memory II AM 6 6 9   Verbal Paired Associates I AM 10 6 9   Verbal Paired Associates II AM 5 9 37  Visual Reproduction I VM 28 10 50  Visual Reproduction II VM 11 8 25   Symbol Span VWM 10 8 25    PROCESS SCORE CONVERSIONS  Auditory Memory Process Score Summary  Process Score Raw Score Scaled Score Percentile Rank Cumulative Percentage (Base Rate)  LM II  Recognition 13 - - 3-9%  VPA II Recognition 27 - - 26-50%   Visual Memory Process Score Summary  Process Score Raw Score Scaled Score Percentile Rank Cumulative Percentage (Base Rate)  VR II Recognition 3 - - 26-50%   ABILITY-MEMORY ANALYSIS  Ability Score:  GAI: 115 Date of Testing:  WAIS-IV; WMS-IV 2019/06/07  Predicted Difference Method   Index Predicted WMS-IV Index Score Actual WMS-IV Index Score Difference Critical Value  Significant Difference Y/N Base Rate  Auditory Memory 108 80 28 9.16 Y 1-2%  Visual Memory 109 95 14 8.46 Y 10-15%  Immediate Memory 110 81 29 10.38 Y <1%  Delayed Memory 109 85 24 12.01 Y 3%  Statistical significance (critical value) at the .01 level.   Contrast Scaled Scores  Score Score 1 Score 2 Contrast Scaled Score  General Ability Index vs. Auditory Memory Index 115 80 4  General Ability Index vs. Visual Memory Index 115 95 6  General Ability Index vs. Immediate Memory Index 115 81 2  General Ability Index vs. Delayed Memory Index 115 85 5  Verbal Comprehension Index vs. Auditory Memory Index 112 80 4  Perceptual Reasoning Index vs. Visual Memory Index 115 95 6  Working Memory Index vs. Auditory Memory Index 95 80 6

## 2019-06-14 ENCOUNTER — Other Ambulatory Visit: Payer: Self-pay

## 2019-06-14 ENCOUNTER — Encounter (HOSPITAL_BASED_OUTPATIENT_CLINIC_OR_DEPARTMENT_OTHER): Payer: Medicare HMO | Admitting: Psychology

## 2019-06-14 ENCOUNTER — Encounter: Payer: Self-pay | Admitting: Psychology

## 2019-06-14 DIAGNOSIS — G3109 Other frontotemporal dementia: Secondary | ICD-10-CM

## 2019-06-14 DIAGNOSIS — F0280 Dementia in other diseases classified elsewhere without behavioral disturbance: Secondary | ICD-10-CM

## 2019-06-14 DIAGNOSIS — R413 Other amnesia: Secondary | ICD-10-CM

## 2019-06-14 DIAGNOSIS — R4789 Other speech disturbances: Secondary | ICD-10-CM

## 2019-06-14 DIAGNOSIS — R419 Unspecified symptoms and signs involving cognitive functions and awareness: Secondary | ICD-10-CM | POA: Diagnosis not present

## 2019-06-14 NOTE — Progress Notes (Signed)
Neuropsychological Evaluation   Patient:  Joseph BORTON Sr.   DOB: [birth year]  MR Number: [id number]  Location: [facility] MEDICINE [facility] PHYSICAL MEDICINE AND REHABILITATION [address] [place], [address] [id number] [facility] [place] [address] Dept: [phone]  Start: 9 AM End: 10 AM  Provider/Observer:     Hershal Coria PsyD  Chief Complaint:      Chief Complaint  Patient presents with  . Memory Loss    Reason For Service:     Joseph Chura. Joseph Beavers. is a 78 year old male referred by Dr. Frances Benson, with [facility], for neuropsychological evaluation.  The patient describes a history of memory loss/memory changes that have been present to some degree over the past 10 years and has been gradually progressing.  However, the patient's wife reports that over the last 6 to 9 months that these memory difficulties have been more problematic and apparent.  The patient also describes word finding issues and circumlocutions but no paraphasic errors.  The patient denies any geographic disorientation but does utilize GPS at night.  The patient denies any motor changes or motor difficulties.  The patient has a medical history that includes history of post polio disease, hyperkeratosis, history of [facility] spotted fever, reflux disease, COPD, BPH, and overweight state.  The patient and his wife both report that there has been a gradual decline in memory issues over the past 2+ years and the patient's wife reports that she started noticing symptoms as much as 10 years ago.  They both report that these have been more noticeable over the past year.  The patient is also described as having word finding issues.  The patient reports that he can be in conversations and can find the words that he wants to say.  He will ultimately use circumlocutions of fine replacement words and both the patient and his wife deny any paraphasic errors.  The patient  also describes short-term memory issues including losing his train of thought and forgetting why he went into a particular room.  The patient's wife reports that the short-term memory issues that she noticed is that he appears to hear what others are saying but that he does not remember what others have said afterwards in many situations.  Most of these issues have to do with short-term memory recall and the patient reports that sometimes cueing helps with his memory.  The patient's mother died when she was 30 years old of liver cancer.  The patient reports that his father died at 40 years of age.  He reports that towards the end of his life that he started having memory issues and significant geographic disorientation that led to having his driver's license taken away.  The father is reported to have lapses of memory and significant occasions of getting lost while driving on at least 3 separate occasions where he actually left the state and ended up in another state.  The patient reports that he has been very frustrated with his poor recall of memory issues and especially with issues with his vocabulary and word finding issues.  The patient describes some issues with postural hypotension but denies any history of loss of consciousness or concussive events/MVAs.  The patient denies ever being exposed to solvents on any regular basis.  He does report some mild tremors in his hand but they are not constant.  The patient reports that his sleep is quite good and that he can sleep anywhere at  any time and this is always been true.  The patient was evaluated for sleep apnea and reports that he was not found to have sleep apnea but does have significant COPD.  The patient reports that he will sleep in a recliner at night because of his snoring and breathing issues.  The patient had an MRI conducted on 02/23/2019.  The MRI showed normal aging brain as far as  impressions.  There were no indications of acute  infarct, acute hemorrhage or extra axial collection.  There were some mild symptoms of white matter hyperintensity that was felt to be due to chronic ischemic microangiopathy but was not unexpected for age.  Behavioral Observations: Appearance:Casually and appropriately dressed with good hygiene. Gait:Ambulated independently without assistance Speech:Clear, normal rate, tone & volume. Mild-Moderate WFD noted Thought process: Logical & mostly organized. Unremarkable Mood/Affect:Mildly anxious and depressed, appropriate  Interpersonal: Polite and appropriate. Orientation: Oriented x 4 Effort/Motivation: Good   He did not appear to have difficulty seeing, hearing, or understanding test items but required questions (e.g. word problems) to be repeated often. He exhibited below average frustration tolerance on questions he did not know or tasks that were more difficult (e.g. learning and memory items on WMS-IV-OA) He was cooperative with all assigned tasks and persisted well overall (e.g. did not require many breaks).   Tests Administered:  Animal Naming  [employer] (BNT)  Clock Drawing Test  Controlled Oral Word Association Test (COWAT)  Trail Making Test (TMT A & B)  Wechsler Adult Intelligence Scale, 4th Edition (WAIS-IV)  Wechsler Memory Scale, 4th edition, Older Adult Battery (WMS-IV-OA)    Test Results:   Initially, an estimation was made on the patient's likely historical/premorbid cognitive functioning and overall intellectual functioning.  Based on his education and occupational history is estimated that he was likely performing in the high average to superior range of overall cognitive functioning.  Animal Naming Test  Total=11, T=29, 2nd % ? Repetition=1 ? Intrusion=1 [employer]   Total=52/60, T=41, 18th %  COWAT  FAS Total=34, T=43, 25th % ? Repetition=2 ? Intrusion=1   The patient is expressive language functioning were assessed  utilizing both measures of verbal fluency as well as challenge naming measures.  The patient showed some mild difficulties with regard to targeted/challenge naming functions.  However, the patient did show some significant deficits with regard to verbal fluency.  The pattern of difficulties on objective measures of expressive language are consistent with verbal fluency and word finding difficulties described in his subjective symptoms.  The patient describes having circumlocutions and word finding difficulties which are progressively worsening and there are objective signs of reduction in overall verbal fluency.  WAIS-IV          Composite Score Summary   Scale Sum of Scaled Scores Composite Score Percentile Rank 95% Conf. Interval Qualitative Description  Verbal Comprehension 37 VCI 112 79 106-117 High Average  Perceptual Reasoning 38 PRI 115 84 108-120 High Average  Working Memory 18 WMI 95 37 89-102 Average  Processing Speed 32 PSI 132 98 120-137 Very Superior  Full Scale 125 FSIQ 117 87 113-121 High Average  General Ability 75 GAI 115 84 110-119 High Average   The patient was administered the Wechsler Adult Intelligence Scale-IV.  The patient produced a full-scale IQ score of 117 which falls at the 87th percentile and is in the high average range of performance.  We also calculated the patient's general abilities index score which places less weight on measures  of general information processing speed and working memory.  The patient produced a general abilities index score of 115 which falls at the 84th percentile and is in the high average range as well.  These global performances are generally consistent with predicted levels of overall cognitive performances.  Breaking down these measures into their various indices, the patient produced a verbal comprehension index score of 112 which falls at the 79th percentile and is in the high average range.  The patient did well on measures of verbal  reasoning and problem solving, his general vocabulary knowledge, his general fund of information.  There were no indications of significant loss or deficits with regard to verbal comprehension capacities.  The patient produced a perceptual reasoning index score of 115 which follows at the 84th percentile and is in the high average range.  The patient did well on subtests in this index including visual-spatial and visual analysis abilities, visual reasoning and problem solving abilities, visual estimations and judgment abilities.  There were no indications of any visual spatial or perceptual reasoning deficits.  Clock Drawing Test  WNL The patient was also administered a clock drawing test which I will include here.  The patient's clock drawing test was within normal limits and consistent with other measures of visual spatial abilities and visual analysis/organization functioning being within normal limits.  The patient produced a working memory index score of 95 which falls at the 37th percentile and is in the average range.  While this global performance falls in the average range it is significantly below expected levels.  The patient had generally average performance with regard to pure auditory encoding abilities.  However, when there was a requirement to perform any type of multi processing of this initially encoded information the patient's performance showed a significant deterioration.  The patient produced a processing speed index score of 132 which falls at the 98th percentile and is in the very superior range of functioning relative to her normative population.  The patient did very well on measures of visual scanning, visual search and overall speed of mental operations.    Trail Making Test  Part A  ? Time=17.69", T=74, 99th % ? Errors=0  Part B ? Time=144.28, T=38, 12th % ? Errors=2 We also administered the Trail Making Test which assesses similar components to this processing  speed index score.  Initially, on the Trail Making Test part A it is a pure visual scanning/visual searching and overall speed of mental operation measure (focus execute task).  The patient did quite well on this measure performing at the 99th percentile relative to normative expectations and showed 0 errors.  However, the patient showed significant difficulties and significant deterioration in performance when this task is shifted to a measure that requires more executive decision making and shifting attention and concentration and efficient executive functioning.  The patient's performance deteriorated relative to normative population from the 99th percentile on peer information processing speed measures to the 12th percentile on a similar measure that adds an a cognitive shifting and executive functioning component.  The pattern on these various subtest do suggest that attentional difficulties are primarily related to multi processing abilities and more complex encoding abilities where his cognitive shifting, sustaining attention and focus execute abilities are all equal to or better than predicted levels based on his education occupational history.          Differences Between Subtest and Overall Mean of Subtest Scores   Subtest Subtest Scaled Score Mean Scaled Score  Difference Critical Value .05 Strength or Weakness Base Rate  Block Design 12 12.50 -0.50 2.85  >25%  Similarities 14 12.50 1.50 2.82  >25%  Digit Span 10 12.50 -2.50 2.22 W 15-25%  Matrix Reasoning 13 12.50 0.50 2.54  >25%  Vocabulary 11 12.50 -1.50 2.03  >25%  Arithmetic 8 12.50 -4.50 2.73 W 2-5%  Symbol Search 14 12.50 1.50 3.42  >25%  Visual Puzzles 13 12.50 0.50 2.71  >25%  Information 12 12.50 -0.50 2.19  >25%  Coding 18 12.50 5.50 2.97 S 2%   WMS-IV (Older Adult Battery)         Index Score Summary   Index Sum of Scaled Scores Index Score Percentile Rank 95% Confidence Interval Qualitative Descriptor   Auditory Memory (AMI) 26 80 9 75-87 Low Average  Visual Memory (VMI) 18 95 37 90-100 Average  Immediate Memory (IMI) 21 81 10 76-88 Low Average  Delayed Memory (DMI) 23 85 16 79-94 Low Average   The patient was also administered the Wechsler Memory Scale-IV for older adults.  Initially, analysis were made comparing auditory memory functions versus visual memory functions.  The patient produced an auditory memory index score of 80 which falls at the 9th percentile and is in the low average range.  This performance for auditory memory learning is significantly below expectations based on his education occupational history and is also significantly below predicted levels based on his global intellectual and cognitive abilities overall.  The patient's visual memory index score was 95 which fell to the 37th percentile and is in the average range.  While this pattern is not significantly impaired relative to normative population it is below what we would expect based on premorbid variables and other areas of cognitive functioning.  We also analyzed his memory and learning down for immediate versus delayed memory index scores.  Both his immediate and delayed index scores were impaired as well but this was almost exclusively explained by both immediate and delayed auditory memory components.  The significant memory impairments for auditorily presented information were also analyzed under recognition and cued recall formats.  The patient's performance did not improve with cueing and therefore it does indicate that there are significant weaknesses for auditory learning for encoding/storage and organization of auditory information while the patient does much better (but still some relative mild impairments) with regard to visual encoding abilities and storing visual information as well as free recall and cued recall of visual information.  Summary of Results:   Overall, the results of the current objective  neuropsychological evaluation do show a consistent pattern of cognitive strengths and weaknesses that are also consistent with the patient's subjective symptoms described.  The patient describes changes in expressive language and verbal fluency and these were also objectively identified on the neuropsychological testing.  The patient showed reduced verbal fluency but with less significant issues on targeted/cued naming abilities.  The patient does not appear to have any significant reductions in overall global cognitive functioning and the patient showed good performance with regard to verbal comprehension and verbal base skills, visual-spatial and perceptual reasoning skills and exceptional abilities and overall information processing speed.  The patient did show some significant difficulties with regard to cognitive shifting.  The patient had mild weaknesses with regard to auditory encoding but not significant weakness with regard to visual encoding abilities.  Significant memory deficits were noted almost primarily related to auditory memory while visual memory was only mildly down from predicted levels.  The patient's auditory  memory deficits did not improve with cueing or recognition formats strongly suggesting that the memory deficits the patient identifies with both subjective experiences as well as objective measuring are related to impairments with regard to storage and organizational information and not simply related to retrieval deficits.  Overall, the biggest deficits identified had to do with impaired shifting attention, auditory encoding and significant auditory learning/memory deficits as well as changes in verbal fluency.  Impression/Diagnosis:   Overall, the results of the current neuropsychological evaluation are not consistent with those that would be simply associated with microvascular ischemic types of changes in his MRI only suggested age consistent changes cortically in this area, typical  patterns for dementia of the Alzheimer's type or typical patterns for Lewy body type.  There are patterns consistent with a frontotemporal dementia in general.  The patient's overall speed of mental operations and information processing speed as well as visual spatial and visual analysis abilities are all quite good and well preserved.  The patient's deficits were almost exclusively in the area of cognitive shifting, verbal fluency and expressive language functioning and auditory learning and memory.  The patient did have some auditory encoding weaknesses but these were mild.  The patient's difficulties with executive function and shifting abilities were clear and the patient had deficits for storage and organization of auditory information without improvement with cueing.  From a diagnostic perspective this pattern is not consistent with typical findings of conditions such as cerebrovascular mediated changes or typical changes with regard to Alzheimer's dementia or Lewy body dementia.  There are clear reductions in auditory memory, verbal fluency and shifting of attention.  This pattern is primarily mediated by left hemispheric function overall and there does not appear to be any significant parietal lobe involvement.  This could be early changes for condition such as a frontotemporal dementia but the pattern is not overly consistent with this.  The patient does acknowledge some motor changes and tremors as well.  It will be important that we reassess the patient in approximately 9 months to look at any further changes or decrease in overall functioning to provide a more definitive answer as to diagnostic questions.  At this point, I would suggest that we are primarily looking at frontotemporal mediated condition.  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 Neuropsychologist

## 2019-06-18 ENCOUNTER — Ambulatory Visit: Payer: Medicare HMO | Admitting: Neurology

## 2019-06-20 ENCOUNTER — Other Ambulatory Visit: Payer: Self-pay

## 2019-06-20 ENCOUNTER — Encounter: Payer: Medicare HMO | Admitting: Psychology

## 2019-06-20 DIAGNOSIS — R4789 Other speech disturbances: Secondary | ICD-10-CM

## 2019-06-20 DIAGNOSIS — R413 Other amnesia: Secondary | ICD-10-CM

## 2019-06-20 DIAGNOSIS — G3109 Other frontotemporal dementia: Secondary | ICD-10-CM

## 2019-06-20 DIAGNOSIS — R419 Unspecified symptoms and signs involving cognitive functions and awareness: Secondary | ICD-10-CM | POA: Diagnosis not present

## 2019-06-20 DIAGNOSIS — F0280 Dementia in other diseases classified elsewhere without behavioral disturbance: Secondary | ICD-10-CM

## 2019-06-20 NOTE — Progress Notes (Signed)
Today I provided feedback regarding the results of the recent neuropsychological evaluation.  Today's visit was a 1 hour in person visit that was conducted in my outpatient clinic office.  Myself, the patient and his wife were present for this feedback.  We reviewed the results, diagnostic impressions as well as recommendations going forward with the patient and his wife in an in-depth manner.  The complete clinical history/HPI can be found in his electronic medical records dated 06/03/2019 and the full neuropsychological report can be found in these records dated 06/14/2019.  Below you will find the summary and interpretations of this evaluation.    Summary of Results:                        Overall, the results of the current objective neuropsychological evaluation do show a consistent pattern of cognitive strengths and weaknesses that are also consistent with the patient's subjective symptoms described.  The patient describes changes in expressive language and verbal fluency and these were also objectively identified on the neuropsychological testing.  The patient showed reduced verbal fluency but with less significant issues on targeted/cued naming abilities.  The patient does not appear to have any significant reductions in overall global cognitive functioning and the patient showed good performance with regard to verbal comprehension and verbal base skills, visual-spatial and perceptual reasoning skills and exceptional abilities and overall information processing speed.  The patient did show some significant difficulties with regard to cognitive shifting.  The patient had mild weaknesses with regard to auditory encoding but not significant weakness with regard to visual encoding abilities.  Significant memory deficits were noted almost primarily related to auditory memory while visual memory was only mildly down from predicted levels.  The patient's auditory memory deficits did not improve with cueing or  recognition formats strongly suggesting that the memory deficits the patient identifies with both subjective experiences as well as objective measuring are related to impairments with regard to storage and organizational information and not simply related to retrieval deficits.  Overall, the biggest deficits identified had to do with impaired shifting attention, auditory encoding and significant auditory learning/memory deficits as well as changes in verbal fluency.  Impression/Diagnosis:                     Overall, the results of the current neuropsychological evaluation are not consistent with those that would be simply associated with microvascular ischemic types of changes in his MRI only suggested age consistent changes cortically in this area, typical patterns for dementia of the Alzheimer's type or typical patterns for Lewy body type.  There are patterns consistent with a frontotemporal dementia in general.  The patient's overall speed of mental operations and information processing speed as well as visual spatial and visual analysis abilities are all quite good and well preserved.  The patient's deficits were almost exclusively in the area of cognitive shifting, verbal fluency and expressive language functioning and auditory learning and memory.  The patient did have some auditory encoding weaknesses but these were mild.  The patient's difficulties with executive function and shifting abilities were clear and the patient had deficits for storage and organization of auditory information without improvement with cueing.  From a diagnostic perspective this pattern is not consistent with typical findings of conditions such as cerebrovascular mediated changes or typical changes with regard to Alzheimer's dementia or Lewy body dementia.  There are clear reductions in auditory memory, verbal fluency and shifting of  attention.  This pattern is primarily mediated by left hemispheric function overall and there  does not appear to be any significant parietal lobe involvement.  This could be early changes for condition such as a frontotemporal dementia but the pattern is not overly consistent with this.  The patient does acknowledge some motor changes and tremors as well.  It will be important that we reassess the patient in approximately 9 months to look at any further changes or decrease in overall functioning to provide a more definitive answer as to diagnostic questions.  At this point, I would suggest that we are primarily looking at frontotemporal mediated condition.  Diagnosis:                               Axis I: Fronto-temporal dementia ([facility])  Memory loss  Word finding problem   Arley Phenix, Psy.D. Neuropsychologist

## 2019-06-24 ENCOUNTER — Other Ambulatory Visit: Payer: Self-pay | Admitting: Family

## 2019-08-01 ENCOUNTER — Ambulatory Visit: Payer: Medicare HMO | Admitting: Psychology

## 2019-08-07 ENCOUNTER — Encounter: Payer: Self-pay | Admitting: Family

## 2019-08-15 ENCOUNTER — Ambulatory Visit: Payer: Medicare HMO | Admitting: Neurology

## 2019-08-15 ENCOUNTER — Encounter: Payer: Self-pay | Admitting: Neurology

## 2019-08-15 ENCOUNTER — Other Ambulatory Visit: Payer: Self-pay

## 2019-08-15 VITALS — BP 149/85 | HR 67 | Temp 97.5°F | Ht 70.0 in | Wt 189.0 lb

## 2019-08-15 DIAGNOSIS — R4789 Other speech disturbances: Secondary | ICD-10-CM

## 2019-08-15 DIAGNOSIS — R413 Other amnesia: Secondary | ICD-10-CM | POA: Diagnosis not present

## 2019-08-15 NOTE — Patient Instructions (Signed)
We will continue to monitor your memory scores and symptoms as well as your examination.  I would like to repeat your brain MRI in the next couple of months, preferably after you have been vaccinated for COVID-19. Please email me through my chart regarding the status of your immunization and then I can order your brain MRI for comparison with last year's findings. As recommended by Dr. Shelva Majestic, I would also like to repeat your neuropsychological testing later this year. Please follow-up routinely in 4 months.

## 2019-08-15 NOTE — Progress Notes (Signed)
Subjective:    Patient ID: Joseph SKALICKY Sr. is a 79 y.o. male.  HPI     Interim history:   [name] is a 79 year old right-handed gentleman with an underlying medical history of post polio, HLP, history of [place] spotted fever, reflux disease, COPD, BPH, and overweight state, who presents for follow-up consultation of his memory loss.  The patient is accompanied by his wife again today.  I first met him on 02/12/2019 at the request of his primary care nurse practitioner, at which time he reported gradual decline in his memory function over the past 2 years, including difficulty with word finding, recall, forgetfulness, short-term memory, losing train of thought.  His MMSE was 21 out of 30.  I suggested further work-up in the form of brain MRI, and more in-depth neuropsychological evaluation.  He recently had extensive labs through his primary care in June 2020.  He had a brain MRI without contrast on 02/23/2019 and I reviewed the results: IMPRESSION: Normal aging brain. We called him with his test results. He had interim neuropsychological evaluation and testing on 06/14/2019 with a subsequent discussion appointment on 06/20/2019 and I reviewed the results:    << Impression/Diagnosis:                     Overall, the results of the current neuropsychological evaluation are not consistent with those that would be simply associated with microvascular ischemic types of changes in his MRI only suggested age consistent changes cortically in this area, typical patterns for dementia of the Alzheimer's type or typical patterns for Lewy body type.  There are patterns consistent with a frontotemporal dementia in general.  The patient's overall speed of mental operations and information processing speed as well as visual spatial and visual analysis abilities are all quite good and well preserved.  The patient's deficits were almost exclusively in the area of cognitive shifting, verbal fluency and  expressive language functioning and auditory learning and memory.  The patient did have some auditory encoding weaknesses but these were mild.  The patient's difficulties with executive function and shifting abilities were clear and the patient had deficits for storage and organization of auditory information without improvement with cueing.  From a diagnostic perspective this pattern is not consistent with typical findings of conditions such as cerebrovascular mediated changes or typical changes with regard to Alzheimer's dementia or Lewy body dementia.  There are clear reductions in auditory memory, verbal fluency and shifting of attention.  This pattern is primarily mediated by left hemispheric function overall and there does not appear to be any significant parietal lobe involvement.  This could be early changes for condition such as a frontotemporal dementia but the pattern is not overly consistent with this.  The patient does acknowledge some motor changes and tremors as well.   It will be important that we reassess the patient in approximately 9 months to look at any further changes or decrease in overall functioning to provide a more definitive answer as to diagnostic questions.  At this point, I would suggest that we are primarily looking at frontotemporal mediated condition.   Diagnosis:                                Axis I: Fronto-temporal dementia ([place])   Memory loss   Word finding problem >>  Today, 08/15/2019: He reports Feeling stable, no new concerns, his wife reports  that his appetite is not as good, he does not eat enough protein and likes to snack in between meals.  He may not always hydrate well enough by his admission and also per wife's report, no recent falls, no recent major medical issues.  They are making plans to move to [place] for the long-term living plans.  They are in the process of selling their home in [place].  His adoptive daughter is helping them with this.  He has a son  and 3 grandchildren in [place].  Patient's wife has a brother close to here and a sister in [place].She has no children of her own.  They are being very selective in where in [place] they will be living.He continues to paint, he enjoys it.  He has felt overall stable with regards to his memory.  The patient's allergies, current medications, family history, past medical history, past social history, past surgical history and problem list were reviewed and updated as appropriate.   Previously:   02/12/2019: (He) reports memory issues over the past year.  His wife reports a gradual decline in his memory function over the past 2+ years, more noticeable to him in the past year.  His main difficulty is word finding and quick recall, has some forgetfulness, short-term issues, losing train of thought, forgetting why he went to another room etc.  He has never been very good with names to begin with.  His mother died at age 85 from liver cancer, father lived to be 10 years old and had memory issues in the last 5 years of his life, particularly after losing his wife.  His father had lapses in memory, had to give up driving as he had not just gotten lost driving but left the state on 3 occasions, ended up in [place] and one time in [place].  Patient reports no difficulty with his driving.  He had 1 sister who was 61 years older and smoked, she passed away.  He does not recall any dementia in his grandparents.  His wife has noticed that he has become more frustrated with his word finding issues.  He reports that he has been getting together with a group of friends, sometimes as many as over 10 people, they have been going to [employer] for the past 18 years on a regular basis and he has noticed that sometimes during a conversation he loses his train of thought.  The group consists of varying age groups, from 78 years old to 78 year old, he has obviously seen some similar issues in some of his friends.  He denies  any significant anxiety or depression.  He has a history of polio which affected his left leg.  He has a history of sciatica which affects his lower back and radiates to the left.  He used to see [name] who retired, he now sees [name].  He has received injections into his lower back successfully. He has 2 grown children, is retired, quit smoking in February 2001, drinks alcohol in the form of beer, one with supper daily.  He drinks caffeine in the form of coffee, 1 to 3 cups/day on average. I reviewed your video visit note from 01/04/2019 and he had recent blood work through your office on 01/17/2019 which I reviewed.  A1c was 5.8, RPR nonreactive, B12 699, vitamin D borderline at 31.96, PSA normal, TSH normal, LDL 101, total cholesterol 172, lipid panel improved compared to last year.  He was advised to increase his Pravachol  to 40 mg daily for further improvement of his lipid panel.  He is being evaluated for sleep apnea through pulmonology, per wife, he has a home sleep test pending for 03/12/2019.  His pulmonologist is located in [place].  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  He has never been good with names.    His Past Medical History Is Significant For: Past Medical History:  Diagnosis Date  . Bladder outlet obstruction   . BPH (benign prostatic hyperplasia)   . COPD (chronic obstructive pulmonary disease) (HCC)    PULMOLOGIST -- Dr. Raul Del.  [facility])  . Elevated PSA   . GERD (gastroesophageal reflux disease)   . History of exercise stress test    06-26-2009  w/ echo--  normal ETT and stress echo images, no ishemia, ef 65%  . History of [facility] spotted fever    Apr 2015--  no residual  . Lower urinary tract  symptoms (LUTS)   . 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 Urinary hesitancy   . Weak urine stream   . Wears glasses     His Past Surgical History Is Significant For: Past Surgical History:  Procedure Laterality Date  . GREEN LIGHT LASER TURP (TRANSURETHRAL RESECTION OF PROSTATE N/A 07/21/2015   Procedure: GREEN LIGHT LASER TURP (TRANSURETHRAL RESECTION OF PROSTATE;  Surgeon: Festus Aloe, MD;  Location: [facility];  Service: Urology;  Laterality: N/A;  . TONSILLECTOMY  as child    His Family History Is Significant For: Family History  Problem Relation Age of Onset  . Cancer Mother        liver  . Memory loss Father   . COPD Sister   . ADD / ADHD Grandchild     His Social History Is Significant For: Social History   Socioeconomic History  . Marital status: Married    Spouse name: Not on file  . Number of children: Not on file  . Years of education: Not on file  . Highest education level: Not on file  Occupational History  . Not on file  Tobacco Use  . Smoking status: Former Smoker    Years: 30.00    Types: Cigarettes    Quit date: 04/26/2000    Years since quitting: 19.3  . Smokeless tobacco: Never Used  Substance and Sexual Activity  . Alcohol use: Yes    Alcohol/week: 1.0 standard drinks    Types: 1 Cans of beer per week    Comment: occasional  . Drug use: No  . Sexual activity: Never  Other Topics Concern  . Not on file  Social History Narrative   Retired from [employer], professor. Also worked for [employer] for 26 years.    Paints for fun   Wife is patient   Social Determinants of Radio broadcast assistant Strain:   . Difficulty of Paying Living Expenses: Not on file  Food Insecurity:   . Worried About Charity fundraiser in the Last Year: Not on file  . Ran Out of Food in the Last Year: Not on file  Transportation Needs:   . Lack of Transportation (Medical): Not on file  .  Lack of  Transportation (Non-Medical): Not on file  Physical Activity:   . Days of Exercise per Week: Not on file  . Minutes of Exercise per Session: Not on file  Stress:   . Feeling of Stress : Not on file  Social Connections:   . Frequency of Communication with Friends and Family: Not on file  . Frequency of Social Gatherings with Friends and Family: Not on file  . Attends Religious Services: Not on file  . Active Member of Clubs or Organizations: Not on file  . Attends Archivist Meetings: Not on file  . Marital Status: Not on file    His Allergies Are:  Allergies  Allergen Reactions  . Spiriva [Tiotropium Bromide Monohydrate]     Worsening lung function  :   His Current Medications Are:  Outpatient Encounter Medications as of 08/15/2019  Medication Sig  . albuterol (PROVENTIL HFA;VENTOLIN HFA) 108 (90 BASE) MCG/ACT inhaler Inhale 2 puffs into the lungs every 4 (four) hours as needed.  . cetirizine (ZYRTEC) 10 MG tablet Take 10 mg by mouth at bedtime.   . docusate sodium (COLACE) 100 MG capsule Take 100 mg by mouth every evening.  Marland Kitchen glucosamine-chondroitin 500-400 MG tablet Take 1 tablet by mouth 3 (three) times daily.  Marland Kitchen omeprazole (PRILOSEC) 20 MG capsule Take 20 mg by mouth as needed.  Marland Kitchen PARoxetine (PAXIL) 20 MG tablet TAKE 1 TABLET BY MOUTH ONCE DAILY  . pravastatin (PRAVACHOL) 40 MG tablet Take 1 tablet (40 mg total) by mouth daily.  . TRELEGY ELLIPTA 100-62.5-25 MCG/INH AEPB Inhale 1 puff into the lungs daily.    No facility-administered encounter medications on file as of 08/15/2019.  :  Review of Systems:  Out of a complete 14 point review of systems, all are reviewed and negative with the exception of these symptoms as listed below:  Review of Systems  Neurological:       Pt reports no change since his last visit. Reports memory is the same since last visit.     Objective:  Neurological Exam  Physical Exam Physical Examination:   Vitals:   08/15/19 0939   BP: (!) 149/85  Pulse: 67  Temp: (!) 97.5 F (36.4 C)   General Examination: The patient is a very pleasant 79 y.o. male in no acute distress. He appears well-developed and well-nourished and well groomed. He is just a little bit restless appearing with shifting his legs and feet today.  HEENT: Normocephalic, atraumatic, pupils are equal, round and reactive to light and accommodation. Extraocular tracking is good without limitation to gaze excursion or nystagmus noted. Normal smooth pursuit is noted. Hearing is grossly intact. Face is symmetric with normal facial animation and normal facial sensation. Speech is clear with no dysarthria noted. There is no hypophonia. There is no lip, neck/head, jaw or voice tremor. Neck is supple with full range of passive and active motion. There are no carotid bruits on auscultation. Oropharynx exam reveals: moderate mouth dryness, adequate dental hygiene and moderate airway crowding. Tongue protrudes centrally and palate elevates symmetrically.   Chest: Clear to auscultation without wheezing, rhonchi or crackles noted.  Heart: S1+S2+0, regular and normal without murmurs, rubs or gallops noted.   Abdomen: Soft, non-tender and non-distended with normal bowel sounds appreciated on auscultation.  Extremities: There is no pitting edema in the distal lower extremities bilaterally. Pedal pulses are intact.  Skin: Warm and dry without trophic changes noted.  Musculoskeletal: exam reveals no obvious joint deformities, tenderness or  joint swelling or erythema.   Neurologically:  Mental status: The patient is awake, alert and oriented in all 4 spheres. His immediate and remote memory, attention, language skills and fund of knowledge are fairly appropriate. There is no evidence of aphasia, agnosia, apraxia or anomia. Speech is clear with normal prosody and enunciation. Thought process is linear. Mood is normal and affect is normal.  He is less verbal today,  history is primarily provided by his wife, but he is able to give a good amount of information.   On 02/12/2019: MMSE: 21/30 (He lost 2 points on the orientation, 4 points on serial sevens, 3 on remote recall). Clock drawing (CDT): 4/4, AFT: 14/min. On 08/15/2019: MMSE: 27/30, CDT: 4/4, AFT: 11/min.  Cranial nerves II - XII are as described above under HEENT exam.  Motor exam: Normal bulk, strength and tone is noted. There is no drift, tremor or rebound. Fine motor skills and coordination: intact in the UEs and LEs.  Cerebellar testing: No dysmetria or intention tremor. There is no truncal or gait ataxia.  Sensory exam: intact to light touch in the upper and lower extremities.  Gait, station and balance: He stands easily, no change.   Assessment and Plan:   In summary, KOUA DEEG Sr. is a very pleasant 79 year old male with an underlying medical history of post polio, HLP, history of [facility] spotted fever, reflux disease, COPD, BPH, and overweight state, who Presents for follow-up consultation of his memory loss of at least 2 and half years duration, particularly with regards to her word finding difficulties.His MRI from July 2020 was rather reassuring, neuropsychological test results concerning for a possible frontotemporal type dementia.  His memory scores are much better today as far as the MMSE is concerned.  Nevertheless, we should continue to monitor closely.  I would like to repeat his brain MRI for comparison with last year's findings and also consider repeat neuropsychological testing about 9 months from the original test.  They are agreeable but would like to wait for the MRI until after his vaccination for COVID-19 which is understandable. They are making plans to move to [place] and they have put their home on the market.  We will assist in any way we can for a smooth transition to their new home state. We talked about the importance of maintaining a healthy lifestyle, he is  encouraged to stay better hydrated with water.  We talked about healthy nutrition and exercise today.  He is still interested and active in his painting.  I did not recommend any new medications. I would like to see him back in about 4 months, sooner if needed.  I answered all their questions today and the patient and his wife are in agreement. I spent 40 minutes in total face-to-face time and in reviewing records during pre-charting, more than 50% of which was spent in counseling and coordination of care, reviewing test results, reviewing medication and discussing or reviewing the diagnosis of memory loss, the prognosis and treatment options. Pertinent laboratory and imaging test results that were available during this visit with the patient were reviewed by me and considered in my medical decision making (see chart for details).

## 2019-09-27 ENCOUNTER — Ambulatory Visit: Payer: Medicare HMO | Admitting: Psychology

## 2019-10-02 ENCOUNTER — Ambulatory Visit: Payer: Self-pay | Admitting: Neurology

## 2019-10-08 ENCOUNTER — Ambulatory Visit: Payer: Self-pay | Admitting: Neurology

## 2019-11-29 DIAGNOSIS — N3001 Acute cystitis with hematuria: Secondary | ICD-10-CM | POA: Diagnosis not present

## 2019-11-29 DIAGNOSIS — R310 Gross hematuria: Secondary | ICD-10-CM | POA: Diagnosis not present

## 2019-12-17 ENCOUNTER — Ambulatory Visit: Payer: Medicare HMO | Admitting: Neurology

## 2020-01-03 ENCOUNTER — Ambulatory Visit: Payer: Medicare HMO

## 2020-01-31 IMAGING — MR MR HEAD WO/W CM
12 series · 48 of 48 positions shown · IV contrast (multihance)
Comparison: None.

CLINICAL DATA: 76-year-old male with episodic vertigo and dizziness
for 1 year. Progressive symptoms over the past 2 months. Balance
difficulty especially after standing or sitting up.

EXAM:
MRI HEAD WITHOUT AND WITH CONTRAST
TECHNIQUE: Multiplanar, multiecho pulse sequences of the brain and surrounding
structures were obtained without and with intravenous contrast.
CONTRAST:  17mL MULTIHANCE GADOBENATE DIMEGLUMINE 529 MG/ML IV SOLN

[Series 2: T1 · sagittal · 5.0mm · 0.45mm/px · 3 of 27 slices shown (1 of 2)]
[im 1/27]
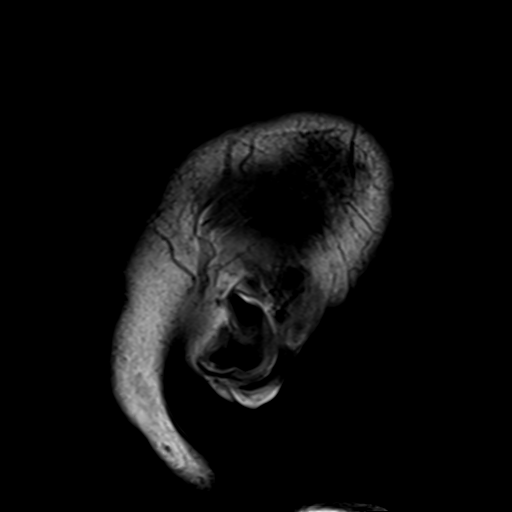
[im 14/27]
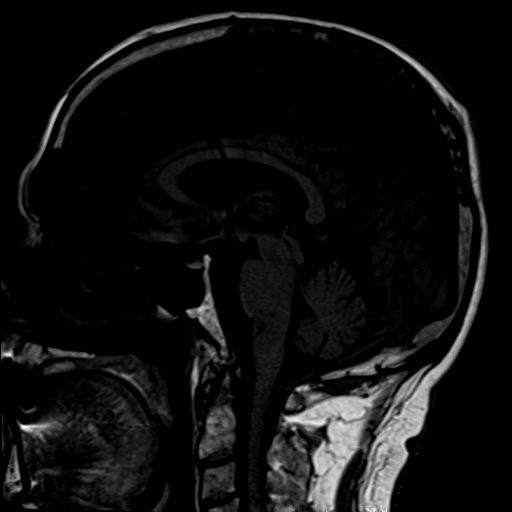
[im 27/27]
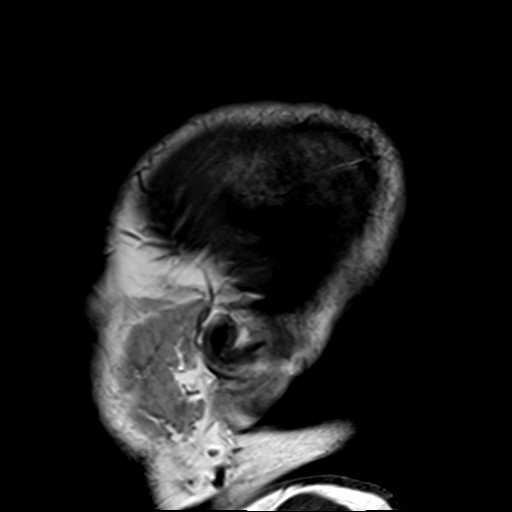

[Series 4: DWI · axial · 3.0mm · 1.80mm/px · z∈[-32,+128]mm · 3 of 55 slices shown (1 of 2)]
[im 1/55]
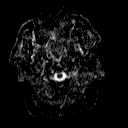
[im 28/55]
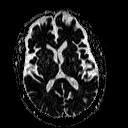
[im 55/55]
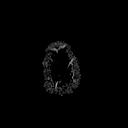

[Series 6: DWI · coronal · 3.0mm · 1.80mm/px · 3 of 48 slices shown (2 of 2)]
[im 1/48]
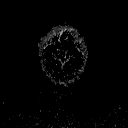
[im 24/48]
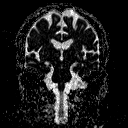
[im 48/48]
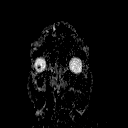

[Series 7: T2 · axial · 5.0mm · 0.60mm/px · z∈[-35,+131]mm · 2 of 27 slices shown (1 of 2)]
[im 1/27]
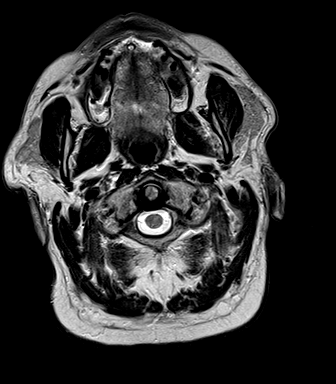
[im 27/27]
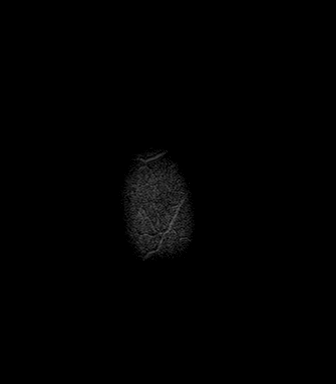

[Series 8: FLAIR · axial · 3.0mm · 0.45mm/px · z∈[-31,+126]mm · 3 of 54 slices shown]
[im 1/54]
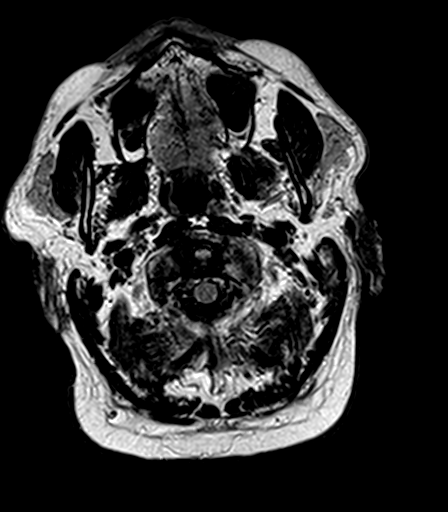
[im 27/54]
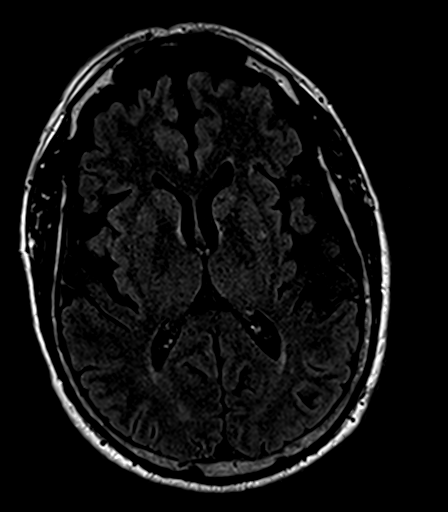
[im 54/54]
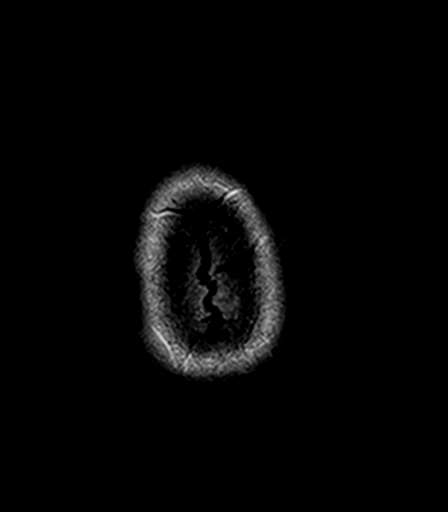

[Series 9: T2 · axial · 5.0mm · 0.45mm/px · z∈[-35,+131]mm · 2 of 27 slices shown (2 of 2)]
[im 1/27]
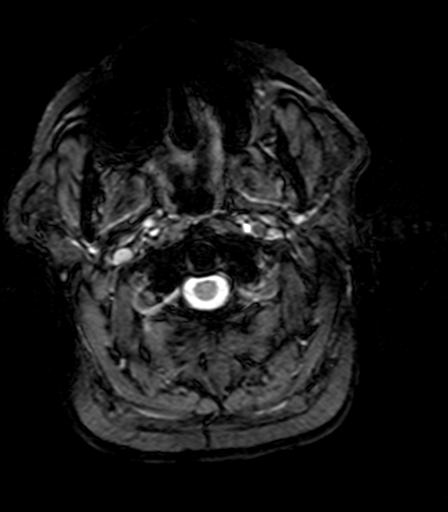
[im 27/27]
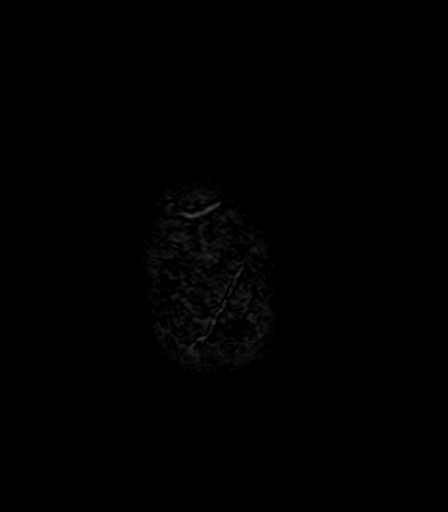

[Series 10: T1 · axial · 1.0mm · 1.00mm/px · z∈[-36,+136]mm · 11 of 176 slices shown (2 of 2)]
[im 1/176]
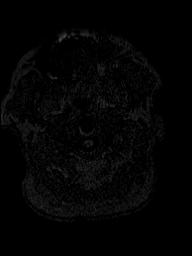
[im 18/176]
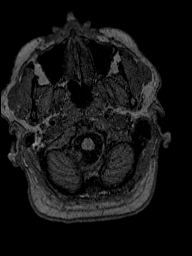
[im 36/176]
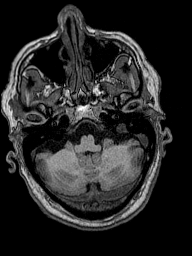
[im 53/176]
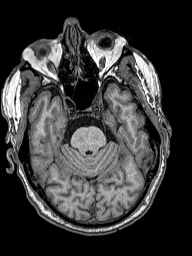
[im 71/176]
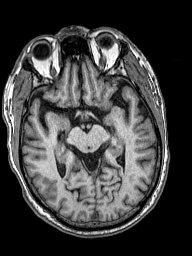
[im 88/176]
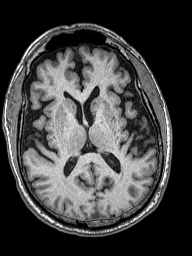
[im 106/176]
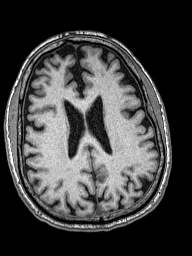
[im 123/176]
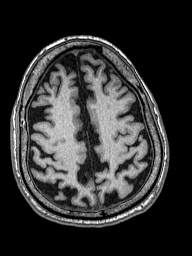
[im 141/176]
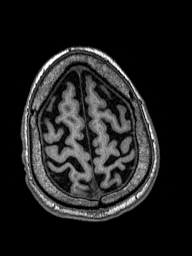
[im 158/176]
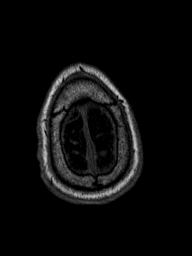
[im 176/176]
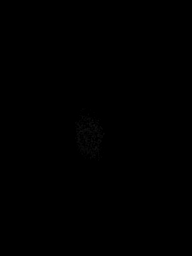

[Series 11: T2 post-contrast · coronal · 5.0mm · 0.49mm/px · 2 of 31 slices shown]
[im 1/31]
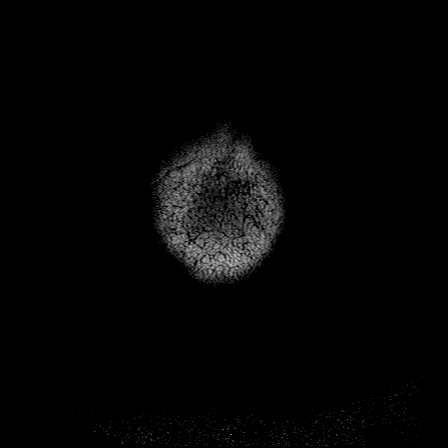
[im 31/31]
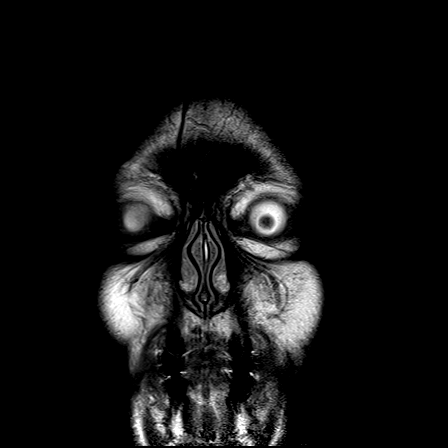

[Series 12: T1 post-contrast · axial · 1.0mm · 1.00mm/px · z∈[-36,+136]mm · 11 of 176 slices shown (1 of 2)]
[im 1/176]
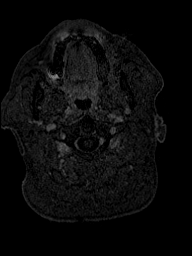
[im 18/176]
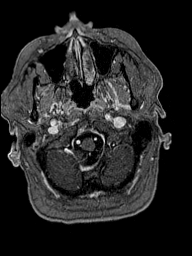
[im 36/176]
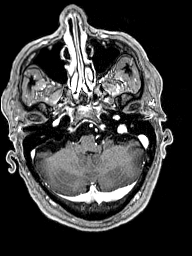
[im 53/176]
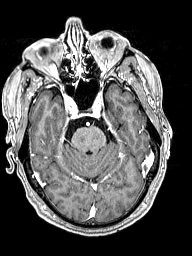
[im 71/176]
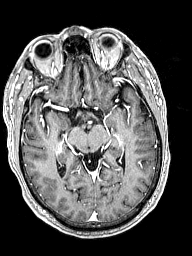
[im 88/176]
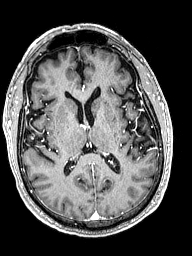
[im 106/176]
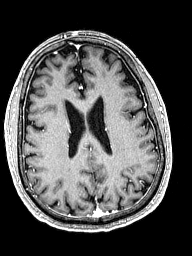
[im 123/176]
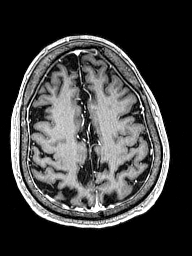
[im 141/176]
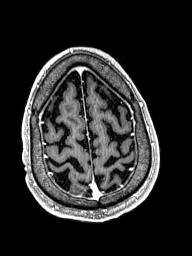
[im 158/176]
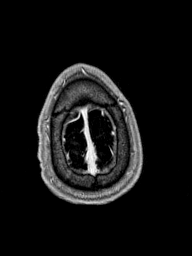
[im 176/176]
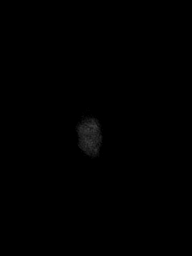

[Series 13: T1 post-contrast · coronal · 5.0mm · 0.43mm/px · 2 of 31 slices shown (2 of 2)]
[im 1/31]
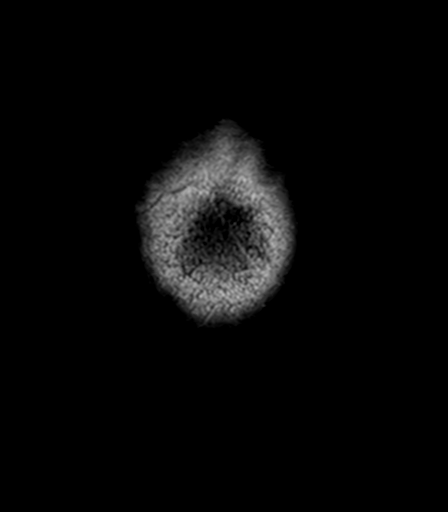
[im 31/31]
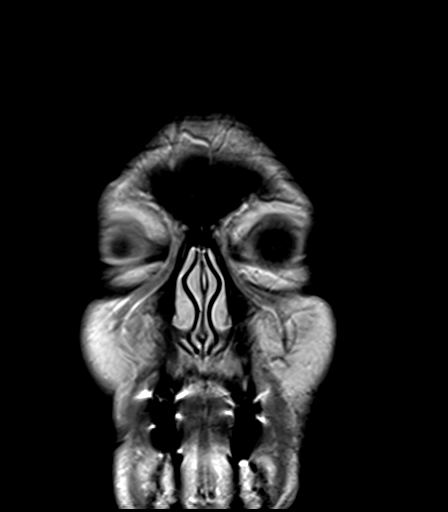

[Series 100: ax (id) · axial · 3.0mm · 1.80mm/px · z∈[-32,+128]mm · 3 of 55 slices shown]
[im 1/55]
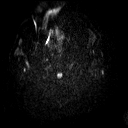
[im 28/55]
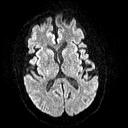
[im 55/55]
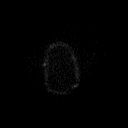

[Series 101: cor (id) · coronal · 3.0mm · 1.80mm/px · 3 of 48 slices shown]
[im 1/48]
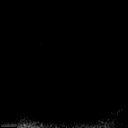
[im 24/48]
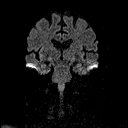
[im 48/48]
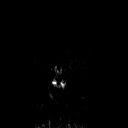

[48 of 48 positions shown; findings below may reference images not displayed]

FINDINGS: Brain: Cerebral volume is within normal limits for age. No
restricted diffusion to suggest acute infarction. No midline shift,
mass effect, evidence of mass lesion, ventriculomegaly, extra-axial
collection or acute intracranial hemorrhage. Cervicomedullary
junction and pituitary are within normal limits.

No cortical encephalomalacia or chronic cerebral blood products
identified. Mild for age nonspecific bilateral cerebral white matter
T2 and FLAIR hyperintensity, mostly periatrial. There is mild T2
heterogeneity in the bilateral deep gray matter nuclei-most
pronounced in the left lentiform - which appears largely related to
perivascular spaces. A left lentiform chronic lacune is suspected.
Mild patchy T2 hyperintensity in the pons.

There is mild mass effect on the left pontomedullary junction
related to tortuosity of the distal left vertebral artery and/or
PICA (series 7, image 8). Lesser similar tortuosity is noted on the
right side. The lower brainstem and the cerebellum are otherwise
normal. No abnormal enhancement identified. No dural thickening.

Vascular: Major intracranial vascular flow voids are preserved. The
major dural venous sinuses are enhancing and appear patent.

Skull and upper cervical spine: Negative visible cervical spine.
Normal bone marrow signal.

Sinuses/Orbits: Normal orbits soft tissues. Paranasal sinuses are
clear.

Other: Bilateral mastoid air cells are clear.

The left pontomedullary junction arterial tortuosity stated above is
in proximity to the left [DATE] cranial nerve root entry zone. However,
otherwise the Visible bilateral internal auditory structures appear
normal. Normal stylomastoid foramina.

There is chronic asymmetric atrophy of the posterior left parotid
gland. Otherwise the visible scalp and orbits soft tissues appear
negative.
IMPRESSION: 1.  No acute intracranial abnormality.
2. Signal changes in the brain compatible with mild for age chronic
small vessel disease.
3. Tortuosity of the distal left vertebral artery and/or PICA with
mild mass effect on the left pontomedullary junction. This is in
proximity to the left 7th/8th cranial nerve root entry zone, and
sometimes such an appearance can been implicated in an ipsilateral
[DATE] cranial neuropathy.
Otherwise negative visible bilateral internal auditory structures.

## 2020-02-06 ENCOUNTER — Other Ambulatory Visit: Payer: Self-pay | Admitting: Family

## 2020-03-23 ENCOUNTER — Encounter: Payer: Medicare HMO | Attending: Psychology | Admitting: Psychology

## 2020-05-16 ENCOUNTER — Other Ambulatory Visit: Payer: Self-pay | Admitting: Family

## 2020-08-12 ENCOUNTER — Other Ambulatory Visit: Payer: Self-pay | Admitting: Family
